# Patient Record
Sex: Female | Born: 1985 | Race: Black or African American | Hispanic: No | Marital: Single | Smoking: Former smoker
Health system: Southern US, Community
[De-identification: ages and names within clinical notes are randomized; demographics above are authoritative.]

## PROBLEM LIST (undated history)

## (undated) DIAGNOSIS — G43909 Migraine, unspecified, not intractable, without status migrainosus: Secondary | ICD-10-CM

## (undated) HISTORY — PX: NO PAST SURGERIES: SHX2092

---
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
## 2007-03-23 ENCOUNTER — Observation Stay: Payer: Self-pay | Admitting: Obstetrics & Gynecology

## 2007-04-06 ENCOUNTER — Inpatient Hospital Stay: Payer: Self-pay | Admitting: Obstetrics and Gynecology

## 2008-12-24 ENCOUNTER — Emergency Department: Payer: Self-pay | Admitting: Emergency Medicine

## 2009-03-12 ENCOUNTER — Observation Stay: Payer: Self-pay

## 2009-06-18 ENCOUNTER — Observation Stay: Payer: Self-pay | Admitting: Obstetrics and Gynecology

## 2009-06-20 ENCOUNTER — Inpatient Hospital Stay: Payer: Self-pay

## 2009-07-20 ENCOUNTER — Emergency Department: Payer: Self-pay | Admitting: Emergency Medicine

## 2010-02-16 ENCOUNTER — Emergency Department: Payer: Self-pay | Admitting: Emergency Medicine

## 2010-02-17 ENCOUNTER — Emergency Department: Payer: Self-pay | Admitting: Emergency Medicine

## 2010-08-13 ENCOUNTER — Emergency Department: Payer: Self-pay

## 2012-12-10 ENCOUNTER — Emergency Department: Payer: Self-pay | Admitting: Internal Medicine
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
## 2016-11-27 ENCOUNTER — Emergency Department
Admission: EM | Admit: 2016-11-27 | Discharge: 2016-11-27 | Disposition: A | Discharge status: Home / Self Care | Payer: Medicaid Other | Attending: Emergency Medicine | Admitting: Emergency Medicine

## 2016-11-27 DIAGNOSIS — Z87891 Personal history of nicotine dependence: Secondary | ICD-10-CM | POA: Insufficient documentation

## 2016-11-27 DIAGNOSIS — R51 Headache: Secondary | ICD-10-CM | POA: Insufficient documentation

## 2016-11-27 DIAGNOSIS — M542 Cervicalgia: Secondary | ICD-10-CM

## 2016-11-27 DIAGNOSIS — R519 Headache, unspecified: Secondary | ICD-10-CM

## 2016-11-27 DIAGNOSIS — M436 Torticollis: Secondary | ICD-10-CM | POA: Diagnosis not present

## 2016-11-27 HISTORY — DX: Migraine, unspecified, not intractable, without status migrainosus: G43.909

## 2016-11-27 MED ORDER — LIDOCAINE 5 % EX PTCH: 1.0000 | MEDICATED_PATCH | Freq: Two times a day (BID) | CUTANEOUS | 0 refills | Status: AC

## 2016-11-27 MED ORDER — LIDOCAINE 5 % EX PTCH: 1.0000 | MEDICATED_PATCH | Freq: Two times a day (BID) | CUTANEOUS | 0 refills | Status: DC

## 2016-11-27 NOTE — ED Provider Notes (Signed)
[facility] Emergency Department Provider Note  ____________________________________________   First MD Initiated Contact with Patient 11/27/16 [phone]     (approximate)  I have reviewed the triage vital signs and the nursing notes.   HISTORY  Chief Complaint Neck Pain and Headache    HPI Cheryl Cameron is a 31 y.o. female G5P3 at 7810 weeks gestation who presents for evaluation of about 3 weeks of persistent yet intermittent left-sided neck and upper shoulder pain that radiates into her head.  She reports that the symptoms seem to be getting worse.  They occur almost exclusively at night.  She feels like she can turn her head to the left but feels more comfortable turning her body.  She suspects it is positional but she cannot change her sleeping position to keep it from happening.  She does find some relief with turning her head to the right as opposed to the left.  She feels like it is deep inside at the area where her shoulder meets her neck.  She describes as a dull aching pain.  She has no numbness nor tingling in any of her extremities.  She has no difficulty with ambulation.  She denies fever/chills, nausea, vomiting, chest pain, shortness of breath, abdominal pain, and vaginal bleeding.  She describes the symptoms as severe.  She has been taking Tylenol and Unisom to help her sleep but she reports it is not helping.  She has seen her primary care doctor once for the issue.  Past Medical History:  Diagnosis Date  . Migraines     There are no active problems to display for this patient.   History reviewed. No pertinent surgical history.  Prior to Admission medications   Medication Sig Start Date End Date Taking? Authorizing Provider  lidocaine (LIDODERM) 5 % Place 1 patch every 12 (twelve) hours onto the skin. Remove & Discard patch within 12 hours or as directed by MD.  Wynelle FannyLeave the patch off for 12 hours before applying a new one. 11/27/16 11/27/17  Loleta RoseForbach,  Paublo Warshawsky, MD    Allergies Patient has no known allergies.  No family history on file.  Social History Social History   Tobacco Use  . Smoking status: Former Games developermoker  . Smokeless tobacco: Never Used  Substance Use Topics  . Alcohol use: No    Frequency: Never  . Drug use: No    Review of Systems Constitutional: No fever/chills Cardiovascular: Denies chest pain. Respiratory: Denies shortness of breath. Gastrointestinal: No abdominal pain.  No nausea, no vomiting.  No diarrhea.  No constipation. Genitourinary: Negative for dysuria. No vaginal bleeding Musculoskeletal: Persistent upper left shoulder and neck pain that radiates into left side of head and face Integumentary: Negative for rash. Neurological: Persistent left sided headache that seems to come from her left neck.  No focal weakness or numbness.   ____________________________________________   PHYSICAL EXAM:  VITAL SIGNS: ED Triage Vitals  Enc Vitals Group     BP 11/27/16 0158 (!) 131/99     Pulse Rate 11/27/16 0158 77     Resp 11/27/16 0158 (!) 22     Temp 11/27/16 0158 98.2 F (36.8 C)     Temp Source 11/27/16 0158 Oral     SpO2 11/27/16 0158 99 %     Weight 11/27/16 0158 81.6 kg (180 lb)     Height 11/27/16 0158 1.753 m (5\' 9" )     Head Circumference --      Peak Flow --  Pain Score 11/27/16 0157 8     Pain Loc --      Pain Edu? --      Excl. in GC? --     Constitutional: Alert and oriented. Well appearing and in no acute distress. Eyes: Conjunctivae are normal. PERRL. EOMI.  No ptosis, no evidence of Horner syndrome Head: Atraumatic. Nose: No congestion/rhinnorhea. Mouth/Throat: Mucous membranes are moist. Neck: No stridor.  No meningeal signs.  Holding her neck in a position that suggests torticollis Cardiovascular: Normal rate, regular rhythm. Good peripheral circulation. No carotid bruit, good circulation upon auscultation with no turbulent flow. Respiratory: Normal respiratory effort.  No  retractions.  Gastrointestinal: Soft and nontender. No distention.  Musculoskeletal: No lower extremity tenderness nor edema. No gross deformities of extremities.  MSK discomfort in left side of neck and upper left shoulder without gross deformity or specific focal tenderness to palpation Neurologic:  Normal speech and language. No gross focal neurologic deficits are appreciated.  Skin:  Skin is warm, dry and intact. No rash noted. Psychiatric: Mood and affect are normal. Speech and behavior are normal.  ____________________________________________   LABS (all labs ordered are listed, but only abnormal results are displayed)  Labs Reviewed - No data to display ____________________________________________  EKG  None - EKG not ordered by ED physician ____________________________________________  RADIOLOGY   No results found.  ____________________________________________   PROCEDURES  Critical Care performed: No   Procedure(s) performed:   Procedures   ____________________________________________   INITIAL IMPRESSION / ASSESSMENT AND PLAN / ED COURSE  As part of my medical decision making, I reviewed the following data within the electronic MEDICAL RECORD NUMBER Nursing notes reviewed and incorporated    Differential diagnosis includes, but is not limited to, migraine, carotid dissection, torticollis, Horner syndrome, neoplasm/mass leading to nerve impingement, radiculopathy, musculoskeletal strain.  Even though the duration of symptoms is unusual, she seems to be suffering from torticollis.  I find no evidence of acute or emergent medical condition on my physical exam and I do not feel she would benefit from any imaging.  I explained all this to the patient and given that it should be safe during pregnancy, I recommended that she try a Lidoderm patch on the area at the top of her shoulder where it begins to emerge with the base of her neck and see if this provides some relief.   I encouraged her to read over the information about torticollis and try some of the additional measures although she is already heating pads and neck massage.  I gave my usual and customary return precautions.     ____________________________________________  FINAL CLINICAL IMPRESSION(S) / ED DIAGNOSES  Final diagnoses:  Torticollis  Neck pain  Nonintractable episodic headache, unspecified headache type     MEDICATIONS GIVEN DURING THIS VISIT:  Medications - No data to display   ED Discharge Orders        Ordered    lidocaine (LIDODERM) 5 %  Every 12 hours,   Status:  Discontinued     11/27/16 0535    lidocaine (LIDODERM) 5 %  Every 12 hours     11/27/16 0537       Note:  This document was prepared using Dragon voice recognition software and may include unintentional dictation errors.    Loleta RoseForbach, Anyelin Mogle, MD 11/27/16 [phone]

## 2016-11-27 NOTE — Discharge Instructions (Signed)
Your workup in the Emergency Department today was reassuring.  Please read through the included information and try some of the recommendations.  We prescribed a lidocaine patch which may provide some relief, but if it is not making a difference, please stop using it.  Follow up with your regular doctor.  Return to the emergency department if you develop new or worsening symptoms that concern you.

## 2016-11-27 NOTE — ED Notes (Signed)
Pt states is [redacted] weeks pregnant. Pt states for a month she has had intermittent left temporal headaches and neck pain. Pt states pain is independent of movement. Pt denies htn, fever, rash, vomiting, nausea. Pt states pain is sometimes relieved by change in position in neck. Pt denies photophobia or sensitivity to sound.

## 2016-11-27 NOTE — ED Triage Notes (Signed)
Patient c/o headache X 3 weeks and neck pain beginning last week. Patient denies any injury to area. Patient is [redacted] weeks pregnant.

## 2020-01-19 DIAGNOSIS — U071 COVID-19: Secondary | ICD-10-CM

## 2020-01-19 HISTORY — DX: COVID-19: U07.1

## 2020-07-08 ENCOUNTER — Emergency Department: Payer: Self-pay

## 2020-07-08 ENCOUNTER — Other Ambulatory Visit: Payer: Self-pay

## 2020-07-08 ENCOUNTER — Emergency Department
Admission: EM | Admit: 2020-07-08 | Discharge: 2020-07-08 | Disposition: A | Discharge status: Home / Self Care | Payer: Self-pay | Attending: Emergency Medicine | Admitting: Emergency Medicine

## 2020-07-08 DIAGNOSIS — M79661 Pain in right lower leg: Secondary | ICD-10-CM | POA: Insufficient documentation

## 2020-07-08 DIAGNOSIS — Z87891 Personal history of nicotine dependence: Secondary | ICD-10-CM | POA: Insufficient documentation

## 2020-07-08 DIAGNOSIS — R000 Tachycardia, unspecified: Secondary | ICD-10-CM | POA: Insufficient documentation

## 2020-07-08 MED ORDER — MELOXICAM 15 MG PO TABS: 15.0000 mg | ORAL_TABLET | Freq: Every day | ORAL | 0 refills | Status: AC

## 2020-07-08 NOTE — ED Provider Notes (Signed)
[facility]-EMERGENCY DEPARTMENT  ____________________________________________  Time seen: Approximately 8:22 PM  I have reviewed the triage vital signs and the nursing notes.   HISTORY  Chief Complaint Leg Pain   Historian Patient     HPI Cheryl Cameron is a 35 y.o. female presents to the emergency department with right calf pain.  Patient reports that she was at work 4 days ago when she developed sudden acute right calf pain after she was "pushing against something".  Patient states that she has had tenderness and swelling since that time.  Patient denies prior history of DVT or PE.  She is a non-smoker.  No recent travel or prolonged immobilization.  No pleuritic chest pain or shortness of breath.   Past Medical History:  Diagnosis Date   Migraines      Immunizations up to date:  Yes.     Past Medical History:  Diagnosis Date   Migraines     There are no problems to display for this patient.   No past surgical history on file.  Prior to Admission medications   Medication Sig Start Date End Date Taking? Authorizing Provider  meloxicam (MOBIC) 15 MG tablet Take 1 tablet (15 mg total) by mouth daily for 7 days. 07/08/20 07/15/20 Yes Orvil Feil, PA-C    Allergies Patient has no known allergies.  No family history on file.  Social History Social History   Tobacco Use   Smoking status: Former    Pack years: 0.00   Smokeless tobacco: Never  Substance Use Topics   Alcohol use: No   Drug use: No     Review of Systems  Constitutional: No fever/chills Eyes:  No discharge ENT: No upper respiratory complaints. Respiratory: no cough. No SOB/ use of accessory muscles to breath Gastrointestinal:   No nausea, no vomiting.  No diarrhea.  No constipation. Musculoskeletal: Patient has right calf pain.  Skin: Negative for rash, abrasions, lacerations, ecchymosis.   ____________________________________________   PHYSICAL EXAM:  VITAL SIGNS: ED Triage  Vitals  Enc Vitals Group     BP 07/08/20 1828 (!) 116/92     Pulse Rate 07/08/20 1828 (!) 101     Resp 07/08/20 1828 18     Temp 07/08/20 1828 98.2 F (36.8 C)     Temp Source 07/08/20 1828 Oral     SpO2 07/08/20 1828 100 %     Weight 07/08/20 1829 212 lb (96.2 kg)     Height 07/08/20 1829 5\' 9"  (1.753 m)     Head Circumference --      Peak Flow --      Pain Score 07/08/20 1829 8     Pain Loc --      Pain Edu? --      Excl. in GC? --      Constitutional: Alert and oriented. Well appearing and in no acute distress. Eyes: Conjunctivae are normal. PERRL. EOMI. Head: Atraumatic. ENT: Cardiovascular: Normal rate, regular rhythm. Normal S1 and S2.  Good peripheral circulation. Respiratory: Normal respiratory effort without tachypnea or retractions. Lungs CTAB. Good air entry to the bases with no decreased or absent breath sounds Gastrointestinal: Bowel sounds x 4 quadrants. Soft and nontender to palpation. No guarding or rigidity. No distention. Musculoskeletal: Full range of motion to all extremities. No obvious deformities noted.  Patient has no calf tenderness to palpation.  Lower extremity compartments are soft.  Palpable dorsalis pedis pulse bilaterally and symmetrically. Neurologic:  Normal for age. No gross focal neurologic deficits  are appreciated.  Skin:  Skin is warm, dry and intact. No rash noted. Psychiatric: Mood and affect are normal for age. Speech and behavior are normal.   ____________________________________________   LABS (all labs ordered are listed, but only abnormal results are displayed)  Labs Reviewed - No data to display ____________________________________________  EKG   ____________________________________________  RADIOLOGY Geraldo Pitter, personally viewed and evaluated these images (plain radiographs) as part of my medical decision making, as well as reviewing the written report by the radiologist.  US Venous Img Lower Unilateral  Right  Result Date: 07/08/2020 CLINICAL DATA:  Right leg pain and swelling for 2 days EXAM: RIGHT LOWER EXTREMITY VENOUS DOPPLER ULTRASOUND TECHNIQUE: Gray-scale sonography with graded compression, as well as color Doppler and duplex ultrasound were performed to evaluate the lower extremity deep venous systems from the level of the common femoral vein and including the common femoral, femoral, profunda femoral, popliteal and calf veins including the posterior tibial, peroneal and gastrocnemius veins when visible. The superficial great saphenous vein was also interrogated. Spectral Doppler was utilized to evaluate flow at rest and with distal augmentation maneuvers in the common femoral, femoral and popliteal veins. COMPARISON:  None. FINDINGS: Contralateral Common Femoral Vein: Respiratory phasicity is normal and symmetric with the symptomatic side. No evidence of thrombus. Normal compressibility. Common Femoral Vein: No evidence of thrombus. Normal compressibility, respiratory phasicity and response to augmentation. Saphenofemoral Junction: No evidence of thrombus. Normal compressibility and flow on color Doppler imaging. Profunda Femoral Vein: No evidence of thrombus. Normal compressibility and flow on color Doppler imaging. Femoral Vein: No evidence of thrombus. Normal compressibility, respiratory phasicity and response to augmentation. Popliteal Vein: No evidence of thrombus. Normal compressibility, respiratory phasicity and response to augmentation. Calf Veins: No evidence of thrombus. Normal compressibility and flow on color Doppler imaging. Superficial Great Saphenous Vein: No evidence of thrombus. Normal compressibility. Venous Reflux:  None. Other Findings:  None. IMPRESSION: No evidence of deep venous thrombosis. Electronically Signed   By: Alcide Clever M.D.   On: 07/08/2020 19:14    ____________________________________________    PROCEDURES  Procedure(s) performed:      Procedures     Medications - No data to display   ____________________________________________   INITIAL IMPRESSION / ASSESSMENT AND PLAN / ED COURSE  Pertinent labs & imaging results that were available during my care of the patient were reviewed by me and considered in my medical decision making (see chart for details).    Assessment and plan Right calf pain 35 year old female presents to the emergency department with right calf pain for the past 2 to 3 days.  Vital signs were reassuring aside from mild tachycardia.  Right lower extremity was nonerythematous and nonedematous.  Patient was neurovascularly intact.  Venous ultrasound showed no signs of DVT.  Suspect gastroc strain at this time.  We will start patient on meloxicam once daily for pain and inflammation and have patient follow-up with primary care as needed.      ____________________________________________  FINAL CLINICAL IMPRESSION(S) / ED DIAGNOSES  Final diagnoses:  Right calf pain      NEW MEDICATIONS STARTED DURING THIS VISIT:  ED Discharge Orders          Ordered    meloxicam (MOBIC) 15 MG tablet  Daily        07/08/20 2018                This chart was dictated using voice recognition software/Dragon. Despite best efforts to  proofread, errors can occur which can change the meaning. Any change was purely unintentional.     Orvil Feil, PA-C 07/08/20 2030    Willy Eddy, MD 07/09/20 1038

## 2020-07-08 NOTE — ED Notes (Signed)
See triage note  Presents with pain and swelling to right calf  Noticed the sx's abotu 3 days ago  No injury   Good pulses

## 2020-07-08 NOTE — Discharge Instructions (Addendum)
Take meloxicam once daily for pain and inflammation. 

## 2020-07-08 NOTE — ED Triage Notes (Signed)
Pt to ED POV for right calf pain for the past 3 days with swelling. Denies injury.  Swelling noted to Right calf.  Reports pain with ambulation

## 2020-07-08 NOTE — ED Notes (Signed)
Patient declined discharge vital signs. 

## 2021-05-09 ENCOUNTER — Emergency Department (HOSPITAL_COMMUNITY): Payer: Self-pay | Admitting: Certified Registered"

## 2021-05-09 ENCOUNTER — Other Ambulatory Visit: Payer: Self-pay

## 2021-05-09 ENCOUNTER — Encounter (HOSPITAL_COMMUNITY)
Admission: EM | Disposition: A | Discharge status: Home / Self Care | Payer: Self-pay | Source: Home / Self Care | Attending: Emergency Medicine

## 2021-05-09 ENCOUNTER — Emergency Department (HOSPITAL_BASED_OUTPATIENT_CLINIC_OR_DEPARTMENT_OTHER): Payer: Self-pay | Admitting: Certified Registered"

## 2021-05-09 ENCOUNTER — Encounter (HOSPITAL_COMMUNITY): Payer: Self-pay | Admitting: *Deleted

## 2021-05-09 ENCOUNTER — Ambulatory Visit (HOSPITAL_COMMUNITY)
Admission: EM | Admit: 2021-05-09 | Discharge: 2021-05-09 | Disposition: A | Discharge status: Home / Self Care | Payer: Self-pay | Attending: Emergency Medicine | Admitting: Emergency Medicine

## 2021-05-09 ENCOUNTER — Emergency Department (HOSPITAL_COMMUNITY): Payer: Self-pay

## 2021-05-09 DIAGNOSIS — E669 Obesity, unspecified: Secondary | ICD-10-CM | POA: Insufficient documentation

## 2021-05-09 DIAGNOSIS — K8010 Calculus of gallbladder with chronic cholecystitis without obstruction: Secondary | ICD-10-CM | POA: Insufficient documentation

## 2021-05-09 DIAGNOSIS — K810 Acute cholecystitis: Secondary | ICD-10-CM

## 2021-05-09 DIAGNOSIS — Z87891 Personal history of nicotine dependence: Secondary | ICD-10-CM | POA: Insufficient documentation

## 2021-05-09 DIAGNOSIS — Z6831 Body mass index (BMI) 31.0-31.9, adult: Secondary | ICD-10-CM | POA: Insufficient documentation

## 2021-05-09 HISTORY — PX: CHOLECYSTECTOMY: SHX55

## 2021-05-09 HISTORY — PX: LYSIS OF ADHESION: SHX5961

## 2021-05-09 LAB — COMPREHENSIVE METABOLIC PANEL
ALT: 17 U/L (ref 0–44)
AST: 15 U/L (ref 15–41)
Albumin: 3.6 g/dL (ref 3.5–5.0)
Alkaline Phosphatase: 35 U/L — ABNORMAL LOW (ref 38–126)
Anion gap: 8 (ref 5–15)
BUN: 9 mg/dL (ref 6–20)
CO2: 22 mmol/L (ref 22–32)
Calcium: 8.7 mg/dL — ABNORMAL LOW (ref 8.9–10.3)
Chloride: 106 mmol/L (ref 98–111)
Creatinine, Ser: 0.98 mg/dL (ref 0.44–1.00)
GFR, Estimated: >60 mL/min (ref >60)
Glucose, Bld: 102 mg/dL — ABNORMAL HIGH (ref 70–99)
Potassium: 3.9 mmol/L (ref 3.5–5.1)
Sodium: 136 mmol/L (ref 135–145)
Total Bilirubin: 0.5 mg/dL (ref 0.3–1.2)
Total Protein: 6.5 g/dL (ref 6.5–8.1)

## 2021-05-09 LAB — CBC WITH DIFFERENTIAL/PLATELET
Abs Immature Granulocytes: 0.03 10*3/uL (ref 0.00–0.07)
Basophils Absolute: 0 10*3/uL (ref 0.0–0.1)
Basophils Relative: 0 %
Eosinophils Absolute: 0.2 10*3/uL (ref 0.0–0.5)
Eosinophils Relative: 3 %
HCT: 39.2 % (ref 36.0–46.0)
Hemoglobin: 12.6 g/dL (ref 12.0–15.0)
Immature Granulocytes: 0 %
Lymphocytes Relative: 29 %
Lymphs Abs: 2.8 10*3/uL (ref 0.7–4.0)
MCH: 28.4 pg (ref 26.0–34.0)
MCHC: 32.1 g/dL (ref 30.0–36.0)
MCV: 88.3 fL (ref 80.0–100.0)
Monocytes Absolute: 0.8 10*3/uL (ref 0.1–1.0)
Monocytes Relative: 8 %
Neutro Abs: 5.5 10*3/uL (ref 1.7–7.7)
Neutrophils Relative %: 60 %
Platelets: 347 10*3/uL (ref 150–400)
RBC: 4.44 MIL/uL (ref 3.87–5.11)
RDW: 14.4 % (ref 11.5–15.5)
WBC: 9.4 10*3/uL (ref 4.0–10.5)
nRBC: 0 % (ref 0.0–0.2)

## 2021-05-09 LAB — I-STAT BETA HCG BLOOD, ED (MC, WL, AP ONLY): I-stat hCG, quantitative: <5 m[IU]/mL (ref <5)

## 2021-05-09 LAB — LIPASE, BLOOD: Lipase: 45 U/L (ref 11–51)

## 2021-05-09 SURGERY — LAPAROSCOPIC CHOLECYSTECTOMY WITH INTRAOPERATIVE CHOLANGIOGRAM
Anesthesia: General

## 2021-05-09 MED ORDER — ONDANSETRON 4 MG PO TBDP
4.0000 mg | ORAL_TABLET | Freq: Once | ORAL | Status: AC
  Administered 2021-05-09: 4 mg via ORAL
  Filled 2021-05-09: qty 1

## 2021-05-09 MED ORDER — SCOPOLAMINE 1 MG/3DAYS TD PT72: 1.0000 | MEDICATED_PATCH | TRANSDERMAL | Status: DC

## 2021-05-09 MED ORDER — KETOROLAC TROMETHAMINE 15 MG/ML IJ SOLN
15.0000 mg | Freq: Once | INTRAMUSCULAR | Status: AC
  Administered 2021-05-09: 15 mg via INTRAMUSCULAR
  Filled 2021-05-09: qty 1

## 2021-05-09 MED ORDER — FENTANYL CITRATE (PF) 100 MCG/2ML IJ SOLN: 25.0000 ug | INTRAMUSCULAR | Status: DC | PRN

## 2021-05-09 MED ORDER — SCOPOLAMINE 1 MG/3DAYS TD PT72
MEDICATED_PATCH | TRANSDERMAL | Status: AC
  Administered 2021-05-09: 1.5 mg via TRANSDERMAL
  Filled 2021-05-09: qty 1

## 2021-05-09 MED ORDER — FENTANYL CITRATE (PF) 250 MCG/5ML IJ SOLN
INTRAMUSCULAR | Status: DC | PRN
Start: 2021-05-09 — End: 2021-05-09
  Administered 2021-05-09: 50 ug via INTRAVENOUS
  Administered 2021-05-09: 150 ug via INTRAVENOUS
  Administered 2021-05-09: 50 ug via INTRAVENOUS

## 2021-05-09 MED ORDER — CHLORHEXIDINE GLUCONATE 0.12 % MT SOLN: 15.0000 mL | Freq: Once | OROMUCOSAL | Status: AC

## 2021-05-09 MED ORDER — MIDAZOLAM HCL 2 MG/2ML IJ SOLN
INTRAMUSCULAR | Status: AC
  Filled 2021-05-09: qty 2

## 2021-05-09 MED ORDER — TRAMADOL HCL 50 MG PO TABS: 50.0000 mg | ORAL_TABLET | Freq: Four times a day (QID) | ORAL | 0 refills | Status: AC | PRN

## 2021-05-09 MED ORDER — ACETAMINOPHEN 10 MG/ML IV SOLN
1000.0000 mg | Freq: Once | INTRAVENOUS | Status: DC | PRN
  Administered 2021-05-09: 1000 mg via INTRAVENOUS

## 2021-05-09 MED ORDER — ONDANSETRON HCL 4 MG/2ML IJ SOLN
INTRAMUSCULAR | Status: AC
  Filled 2021-05-09: qty 2

## 2021-05-09 MED ORDER — DOCUSATE SODIUM 100 MG PO CAPS: 100.0000 mg | ORAL_CAPSULE | Freq: Two times a day (BID) | ORAL | 0 refills | Status: AC

## 2021-05-09 MED ORDER — CHLORHEXIDINE GLUCONATE 0.12 % MT SOLN
OROMUCOSAL | Status: AC
  Administered 2021-05-09: 15 mL via OROMUCOSAL
  Filled 2021-05-09: qty 15

## 2021-05-09 MED ORDER — ACETAMINOPHEN 10 MG/ML IV SOLN
INTRAVENOUS | Status: AC
  Filled 2021-05-09: qty 100

## 2021-05-09 MED ORDER — OXYCODONE HCL 5 MG PO TABS: 5.0000 mg | ORAL_TABLET | ORAL | Status: DC | PRN

## 2021-05-09 MED ORDER — SODIUM CHLORIDE 0.9% FLUSH: 3.0000 mL | Freq: Two times a day (BID) | INTRAVENOUS | Status: DC

## 2021-05-09 MED ORDER — BUPIVACAINE-EPINEPHRINE (PF) 0.25% -1:200000 IJ SOLN
INTRAMUSCULAR | Status: AC
  Filled 2021-05-09: qty 30

## 2021-05-09 MED ORDER — ACETAMINOPHEN 650 MG RE SUPP: 650.0000 mg | RECTAL | Status: DC | PRN

## 2021-05-09 MED ORDER — PROPOFOL 10 MG/ML IV BOLUS
INTRAVENOUS | Status: DC | PRN
  Administered 2021-05-09: 200 mg via INTRAVENOUS

## 2021-05-09 MED ORDER — SODIUM CHLORIDE 0.9% FLUSH: 3.0000 mL | INTRAVENOUS | Status: DC | PRN

## 2021-05-09 MED ORDER — ROCURONIUM BROMIDE 10 MG/ML (PF) SYRINGE
PREFILLED_SYRINGE | INTRAVENOUS | Status: AC
  Filled 2021-05-09: qty 10

## 2021-05-09 MED ORDER — ACETAMINOPHEN 325 MG PO TABS: 650.0000 mg | ORAL_TABLET | ORAL | Status: DC | PRN

## 2021-05-09 MED ORDER — SODIUM CHLORIDE 0.9 % IV SOLN: 250.0000 mL | INTRAVENOUS | Status: DC | PRN

## 2021-05-09 MED ORDER — LIDOCAINE 2% (20 MG/ML) 5 ML SYRINGE
INTRAMUSCULAR | Status: DC | PRN
  Administered 2021-05-09: 60 mg via INTRAVENOUS

## 2021-05-09 MED ORDER — OXYCODONE HCL 5 MG PO TABS: 5.0000 mg | ORAL_TABLET | Freq: Once | ORAL | Status: DC | PRN

## 2021-05-09 MED ORDER — 0.9 % SODIUM CHLORIDE (POUR BTL) OPTIME
TOPICAL | Status: DC | PRN
  Administered 2021-05-09: 1000 mL

## 2021-05-09 MED ORDER — SUGAMMADEX SODIUM 200 MG/2ML IV SOLN
INTRAVENOUS | Status: DC | PRN
  Administered 2021-05-09 (×3): 100 mg via INTRAVENOUS

## 2021-05-09 MED ORDER — BUPIVACAINE-EPINEPHRINE 0.25% -1:200000 IJ SOLN
INTRAMUSCULAR | Status: DC | PRN
  Administered 2021-05-09: 19 mL

## 2021-05-09 MED ORDER — DEXAMETHASONE SODIUM PHOSPHATE 10 MG/ML IJ SOLN
INTRAMUSCULAR | Status: DC | PRN
  Administered 2021-05-09: 5 mg via INTRAVENOUS

## 2021-05-09 MED ORDER — OXYCODONE HCL 5 MG/5ML PO SOLN: 5.0000 mg | Freq: Once | ORAL | Status: DC | PRN

## 2021-05-09 MED ORDER — PROPOFOL 10 MG/ML IV BOLUS
INTRAVENOUS | Status: AC
  Filled 2021-05-09: qty 20

## 2021-05-09 MED ORDER — LACTATED RINGERS IV SOLN: INTRAVENOUS | Status: DC

## 2021-05-09 MED ORDER — AMISULPRIDE (ANTIEMETIC) 5 MG/2ML IV SOLN
10.0000 mg | Freq: Once | INTRAVENOUS | Status: AC | PRN
  Administered 2021-05-09: 10 mg via INTRAVENOUS

## 2021-05-09 MED ORDER — SODIUM CHLORIDE 0.9 % IV SOLN
2.0000 g | Freq: Once | INTRAVENOUS | Status: AC
  Administered 2021-05-09: 2 g via INTRAVENOUS
  Filled 2021-05-09: qty 20

## 2021-05-09 MED ORDER — DEXAMETHASONE SODIUM PHOSPHATE 10 MG/ML IJ SOLN
INTRAMUSCULAR | Status: AC
  Filled 2021-05-09: qty 1

## 2021-05-09 MED ORDER — ONDANSETRON HCL 4 MG/2ML IJ SOLN
INTRAMUSCULAR | Status: DC | PRN
  Administered 2021-05-09: 4 mg via INTRAVENOUS

## 2021-05-09 MED ORDER — ORAL CARE MOUTH RINSE: 15.0000 mL | Freq: Once | OROMUCOSAL | Status: AC

## 2021-05-09 MED ORDER — ROCURONIUM BROMIDE 10 MG/ML (PF) SYRINGE
PREFILLED_SYRINGE | INTRAVENOUS | Status: DC | PRN
  Administered 2021-05-09: 50 mg via INTRAVENOUS

## 2021-05-09 MED ORDER — FENTANYL CITRATE (PF) 250 MCG/5ML IJ SOLN
INTRAMUSCULAR | Status: AC
  Filled 2021-05-09: qty 5

## 2021-05-09 MED ORDER — AMISULPRIDE (ANTIEMETIC) 5 MG/2ML IV SOLN
INTRAVENOUS | Status: AC
  Filled 2021-05-09: qty 4

## 2021-05-09 MED ORDER — SODIUM CHLORIDE 0.9 % IR SOLN
Status: DC | PRN
  Administered 2021-05-09: 1000 mL

## 2021-05-09 MED ORDER — MIDAZOLAM HCL 2 MG/2ML IJ SOLN
INTRAMUSCULAR | Status: DC | PRN
  Administered 2021-05-09: 2 mg via INTRAVENOUS

## 2021-05-09 SURGICAL SUPPLY — 40 items
APPLIER CLIP 5 13 M/L LIGAMAX5 (MISCELLANEOUS) ×2
BAG COUNTER SPONGE SURGICOUNT (BAG) ×2 IMPLANT
CANISTER SUCT 3000ML PPV (MISCELLANEOUS) ×2 IMPLANT
CHLORAPREP W/TINT 26 (MISCELLANEOUS) ×2 IMPLANT
CLIP APPLIE 5 13 M/L LIGAMAX5 (MISCELLANEOUS) ×1 IMPLANT
CNTNR URN SCR LID CUP LEK RST (MISCELLANEOUS) ×1 IMPLANT
CONT SPEC 4OZ STRL OR WHT (MISCELLANEOUS) ×1
COVER MAYO STAND STRL (DRAPES) IMPLANT
COVER SURGICAL LIGHT HANDLE (MISCELLANEOUS) ×2 IMPLANT
DERMABOND ADVANCED (GAUZE/BANDAGES/DRESSINGS) ×1
DERMABOND ADVANCED .7 DNX12 (GAUZE/BANDAGES/DRESSINGS) ×1 IMPLANT
DRAPE C-ARM 42X120 X-RAY (DRAPES) IMPLANT
ELECT REM PT RETURN 9FT ADLT (ELECTROSURGICAL) ×2
ELECTRODE REM PT RTRN 9FT ADLT (ELECTROSURGICAL) ×1 IMPLANT
GAUZE 4X4 16PLY ~~LOC~~+RFID DBL (SPONGE) ×2 IMPLANT
GLOVE BIO SURGEON STRL SZ 6 (GLOVE) ×2 IMPLANT
GLOVE INDICATOR 6.5 STRL GRN (GLOVE) ×2 IMPLANT
GOWN STRL REUS W/ TWL LRG LVL3 (GOWN DISPOSABLE) ×3 IMPLANT
GOWN STRL REUS W/TWL LRG LVL3 (GOWN DISPOSABLE) ×3
GRASPER SUT TROCAR 14GX15 (MISCELLANEOUS) ×2 IMPLANT
KIT BASIN OR (CUSTOM PROCEDURE TRAY) ×2 IMPLANT
KIT TURNOVER KIT B (KITS) ×2 IMPLANT
NDL INSUFFLATION 14GA 120MM (NEEDLE) ×1 IMPLANT
NEEDLE INSUFFLATION 14GA 120MM (NEEDLE) ×2 IMPLANT
NS IRRIG 1000ML POUR BTL (IV SOLUTION) ×2 IMPLANT
PAD ARMBOARD 7.5X6 YLW CONV (MISCELLANEOUS) ×2 IMPLANT
POUCH SPECIMEN RETRIEVAL 10MM (ENDOMECHANICALS) ×2 IMPLANT
SCISSORS LAP 5X35 DISP (ENDOMECHANICALS) ×2 IMPLANT
SET CHOLANGIOGRAPH 5 50 .035 (SET/KITS/TRAYS/PACK) IMPLANT
SET IRRIG TUBING LAPAROSCOPIC (IRRIGATION / IRRIGATOR) ×2 IMPLANT
SET TUBE SMOKE EVAC HIGH FLOW (TUBING) ×2 IMPLANT
SLEEVE ENDOPATH XCEL 5M (ENDOMECHANICALS) ×4 IMPLANT
SPONGE T-LAP 18X18 ~~LOC~~+RFID (SPONGE) ×2 IMPLANT
SUT MNCRL AB 4-0 PS2 18 (SUTURE) ×2 IMPLANT
TOWEL GREEN STERILE (TOWEL DISPOSABLE) ×2 IMPLANT
TOWEL GREEN STERILE FF (TOWEL DISPOSABLE) ×2 IMPLANT
TRAY LAPAROSCOPIC MC (CUSTOM PROCEDURE TRAY) ×2 IMPLANT
TROCAR XCEL NON-BLD 11X100MML (ENDOMECHANICALS) ×2 IMPLANT
TROCAR XCEL NON-BLD 5MMX100MML (ENDOMECHANICALS) ×2 IMPLANT
WATER STERILE IRR 1000ML POUR (IV SOLUTION) ×2 IMPLANT

## 2021-05-09 NOTE — Transfer of Care (Signed)
Immediate Anesthesia Transfer of Care Note ? ?Patient: Cheryl Cameron ? ?Procedure(s) Performed: LAPAROSCOPIC CHOLECYSTECTOMY ?LYSIS OF ADHESION ? ?Patient Location: PACU ? ?Anesthesia Type:General ? ?Level of Consciousness: awake, alert  and oriented ? ?Airway & Oxygen Therapy: Patient Spontanous Breathing and Patient connected to nasal cannula oxygen ? ?Post-op Assessment: Report given to RN and Post -op Vital signs reviewed and stable ? ?Post vital signs: Reviewed and stable ? ?Last Vitals:  ?Vitals Value Taken Time  ?BP 116/83 05/09/21 1602  ?Temp 36.6 ?C 05/09/21 1600  ?Pulse 71 05/09/21 1603  ?Resp 22 05/09/21 1603  ?SpO2 96 % 05/09/21 1603  ?Vitals shown include unvalidated device data. ? ?Last Pain:  ?Vitals:  ? 05/09/21 1343  ?TempSrc: Oral  ?PainSc:   ?   ? ?  ? ?Complications: No notable events documented. ?

## 2021-05-09 NOTE — Anesthesia Postprocedure Evaluation (Signed)
Anesthesia Post Note ? ?Patient: Cheryl Cameron ? ?Procedure(s) Performed: LAPAROSCOPIC CHOLECYSTECTOMY ?LYSIS OF ADHESION ? ?  ? ?Patient location during evaluation: PACU ?Anesthesia Type: General ?Level of consciousness: awake ?Pain management: pain level controlled ?Vital Signs Assessment: post-procedure vital signs reviewed and stable ?Respiratory status: spontaneous breathing, nonlabored ventilation, respiratory function stable and patient connected to nasal cannula oxygen ?Cardiovascular status: blood pressure returned to baseline and stable ?Postop Assessment: no apparent nausea or vomiting ?Anesthetic complications: no ? ? ?No notable events documented. ? ?Last Vitals:  ?Vitals:  ? 05/09/21 1615 05/09/21 1630  ?BP: 120/83 113/84  ?Pulse: 73 71  ?Resp: (!) 21 12  ?Temp:    ?SpO2: 93% 98%  ?  ?Last Pain:  ?Vitals:  ? 05/09/21 1630  ?TempSrc:   ?PainSc: 0-No pain  ? ? ?  ?  ?  ?  ?  ?  ? ?Merri Dimaano P Curlie Sittner ? ? ? ? ?

## 2021-05-09 NOTE — Discharge Instructions (Signed)
LAPAROSCOPIC SURGERY: POST OP INSTRUCTIONS   EAT Gradually transition to a high fiber diet with a fiber supplement over the next few weeks after discharge.  Start with a pureed / full liquid diet (see below)  WALK Walk an hour a day.  Control your pain to do that.    CONTROL PAIN Control pain so that you can walk, sleep, tolerate sneezing/coughing, go up/down stairs.  HAVE A BOWEL MOVEMENT DAILY Keep your bowels regular to avoid problems.  OK to try a laxative to override constipation.  OK to use an antidairrheal to slow down diarrhea.  Call if not better after 2 tries  CALL IF YOU HAVE PROBLEMS/CONCERNS Call if you are still struggling despite following these instructions. Call if you have concerns not answered by these instructions    DIET: Follow a light bland diet & liquids the first 24 hours after arrival home, such as soup, liquids, starches, etc.  Be sure to drink plenty of fluids.  Quickly advance to a usual solid diet within a few days.  Avoid fast food or heavy meals as your are more likely to get nauseated or have irregular bowels.  A low-sugar, high-fiber diet for the rest of your life is ideal.  Take your usually prescribed home medications unless otherwise directed.  PAIN CONTROL: Pain is best controlled by a usual combination of three different methods TOGETHER: Ice/Heat Over the counter pain medication Prescription pain medication Most patients will experience some swelling and bruising around the incisions.  Ice packs or heating pads (30-60 minutes up to 6 times a day) will help. Use ice for the first few days to help decrease swelling and bruising, then switch to heat to help relax tight/sore spots and speed recovery.  Some people prefer to use ice alone, heat alone, alternating between ice & heat.  Experiment to what works for you.  Swelling and bruising can take several weeks to resolve.   It is helpful to take an over-the-counter pain medication regularly for the  first few days: Naproxen (Aleve, etc)  Two 220mg tabs twice a day OR Ibuprofen (Advil, etc) Three 200mg tabs four times a day (every meal & bedtime) AND Acetaminophen (Tylenol, etc) 500-650mg four times a day (every meal & bedtime) A  prescription for pain medication (such as oxycodone, hydrocodone, tramadol, gabapentin, methocarbamol, etc) should be given to you upon discharge.  Take your pain medication as prescribed, IF NEEDED.  If you are having problems/concerns with the prescription medicine (does not control pain, nausea, vomiting, rash, itching, etc), please call us [phone] to see if we need to switch you to a different pain medicine that will work better for you and/or control your side effect better. If you need a refill on your pain medication, please give us 48 hour notice.  contact your pharmacy.  They will contact our office to request authorization. Prescriptions will not be filled after 5 pm or on week-ends  Avoid getting constipated.   Between the surgery and the pain medications, it is common to experience some constipation.   Increasing fluid intake and taking a fiber supplement (such as Metamucil, Citrucel, FiberCon, MiraLax, etc) 1-2 times a day regularly will usually help prevent this problem from occurring.   A mild laxative (prune juice, Milk of Magnesia, MiraLax, etc) should be taken according to package directions if there are no bowel movements after 48 hours.   Watch out for diarrhea.   If you have many loose bowel movements, simplify your diet   to bland foods & liquids for a few days.   Stop any stool softeners and decrease your fiber supplement.   Switching to mild anti-diarrheal medications (Kayopectate, Pepto Bismol) can help.   If this worsens or does not improve, please call us.  Wash / shower every day.  You may shower over the skin glue which is waterproof  Glue will flake off after about 2 weeks.  You may leave the incision open to air.  You may replace a  dressing/Band-Aid to cover the incision for comfort if you wish.   ACTIVITIES as tolerated:   You may resume regular (light) daily activities beginning the next day--such as daily self-care, walking, climbing stairs--gradually increasing activities as tolerated.  If you can walk 30 minutes without difficulty, it is safe to try more intense activity such as jogging, treadmill, bicycling, low-impact aerobics, swimming, etc. Save the most intensive and strenuous activity for last such as sit-ups, heavy lifting, contact sports, etc  Refrain from any heavy lifting or straining until you are off narcotics for pain control.   DO NOT PUSH THROUGH PAIN.  Let pain be your guide: If it hurts to do something, don't do it.  Pain is your body warning you to avoid that activity for another week until the pain goes down. You may drive when you are no longer taking prescription pain medication, you can comfortably wear a seatbelt, and you can safely maneuver your car and apply brakes. You may have sexual intercourse when it is comfortable.  FOLLOW UP in our office Please call [facility] at [phone] to set up an appointment to see your surgeon in the office for a follow-up appointment approximately 2-3 weeks after your surgery. Make sure that you call for this appointment the day you arrive home to insure a convenient appointment time.  10. IF YOU HAVE DISABILITY OR FAMILY LEAVE FORMS, BRING THEM TO THE OFFICE FOR PROCESSING.  DO NOT GIVE THEM TO YOUR DOCTOR.   WHEN TO CALL US [phone]: Poor pain control Reactions / problems with new medications (rash/itching, nausea, etc)  Fever over 101.5 F (38.5 C) Inability to urinate Nausea and/or vomiting Worsening swelling or bruising Continued bleeding from incision. Increased pain, redness, or drainage from the incision   The clinic staff is available to answer your questions during regular business hours (8:30am-5pm).  Please don't hesitate to call and ask to  speak to one of our nurses for clinical concerns.   If you have a medical emergency, go to the nearest emergency room or call 911.  A surgeon from [facility] is always on call at the hospitals   [facility] [address], [address], [place], [place]  [address] ? MAIN: [phone] ? TOLL FREE: [phone] ?  FAX [phone] [email or web address]     

## 2021-05-09 NOTE — Anesthesia Preprocedure Evaluation (Addendum)
Anesthesia Evaluation  ?Patient identified by MRN, date of birth, ID band ?Patient awake ? ? ? ?Reviewed: ?Allergy & Precautions, NPO status , Patient's Chart, lab work & pertinent test results ? ?Airway ?Mallampati: II ? ?TM Distance: >3 FB ?Neck ROM: Full ? ? ? Dental ?no notable dental hx. ? ?  ?Pulmonary ?former smoker,  ?  ?Pulmonary exam normal ? ? ? ? ? ? ? Cardiovascular ?negative cardio ROS ?Normal cardiovascular exam ? ? ?  ?Neuro/Psych ? Headaches, negative psych ROS  ? GI/Hepatic ?negative GI ROS, Neg liver ROS,   ?Endo/Other  ?negative endocrine ROS ? Renal/GU ?negative Renal ROS  ? ?  ?Musculoskeletal ?negative musculoskeletal ROS ?(+)  ? Abdominal ?(+) + obese,   ?Peds ? Hematology ?negative hematology ROS ?(+)   ?Anesthesia Other Findings ?Acute Cholecystitis ? Reproductive/Obstetrics ? ?  ? ? ? ? ? ? ? ? ? ? ? ? ? ?  ?  ? ? ? ? ? ? ? ?Anesthesia Physical ?Anesthesia Plan ? ?ASA: 2 ? ?Anesthesia Plan: General  ? ?Post-op Pain Management:   ? ?Induction: Intravenous ? ?PONV Risk Score and Plan: 4 or greater and Ondansetron, Dexamethasone, Midazolam, Scopolamine patch - Pre-op and Treatment may vary due to age or medical condition ? ?Airway Management Planned: Oral ETT ? ?Additional Equipment:  ? ?Intra-op Plan:  ? ?Post-operative Plan: Extubation in OR ? ?Informed Consent: I have reviewed the patients History and Physical, chart, labs and discussed the procedure including the risks, benefits and alternatives for the proposed anesthesia with the patient or authorized representative who has indicated his/her understanding and acceptance.  ? ? ? ?Dental advisory given ? ?Plan Discussed with: CRNA ? ?Anesthesia Plan Comments:   ? ? ? ? ? ?Anesthesia Quick Evaluation ? ?

## 2021-05-09 NOTE — H&P (Signed)
? ? ? ? ?H&P Note ? ?Cheryl Cameron ?1985/10/29  ?[id number].   ? ?Requesting MD: Tanda Rockers, DO ?Chief Complaint/Reason for Consult:  RUQ pain  ? ?HPI:  ?Patient is an otherwise healthy 36 year old female who presented to the ED with abdominal pain since yesterday AM. Pain in epigastric abdomen and RUQ and has progressively worsened since onset. Some nausea since being in the ED but no vomiting. Afebrile. Denies chest pain, SOB changes in bowel/bladder habits. Patient denies having similar episodes of pain previously. Pain only minimally improved after pain medication in the the ED. No prior abdominal surgery. No blood thinners. NKDA.  ? ?ROS: ?Review of Systems  ?Constitutional:  Negative for chills and fever.  ?Respiratory:  Negative for shortness of breath and wheezing.   ?Cardiovascular:  Negative for chest pain and palpitations.  ?Gastrointestinal:  Positive for abdominal pain and nausea. Negative for constipation, diarrhea and vomiting.  ?Genitourinary:  Negative for dysuria, frequency and urgency.  ? ?No family history on file. ? ?Past Medical History:  ?Diagnosis Date  ? Migraines   ? ? ?History reviewed. No pertinent surgical history. ? ?Social History:  reports that she has quit smoking. She has never used smokeless tobacco. She reports that she does not drink alcohol and does not use drugs. ? ?Allergies: No Known Allergies ? ?(Not in a hospital admission) ? ? ?Blood pressure 110/82, pulse 71, temperature 98 ?F (36.7 ?C), temperature source Oral, resp. rate 17, height 5\' 9"  (1.753 m), weight 96.2 kg, last menstrual period 04/08/2021, SpO2 100 %, unknown if currently breastfeeding. ?Physical Exam:  ?General: pleasant, WD, overweight female who is laying in bed in NAD ?HEENT: head is normocephalic, atraumatic.  Sclera are anicteric.  Ears and nose without any masses or lesions.  Mouth is pink and moist ?Heart: regular, rate, and rhythm.  Normal s1,s2. No obvious murmurs, gallops, or rubs noted.  Palpable  radial and pedal pulses bilaterally ?Lungs: CTAB, no wheezes, rhonchi, or rales noted.  Respiratory effort nonlabored ?Abd: soft, ttp in RUQ with positive murphy sign, ND, +BS, no masses, hernias, or organomegaly ?MS: all 4 extremities are symmetrical with no cyanosis, clubbing, or edema. ?Skin: warm and dry with no masses, lesions, or rashes ?Neuro: Cranial nerves 2-12 grossly intact, sensation is normal throughout ?Psych: A&Ox3 with an appropriate affect. ? ? ?Results for orders placed or performed during the hospital encounter of 05/09/21 (from the past 48 hour(s))  ?CBC with Differential     Status: None  ? Collection Time: 05/09/21  3:41 AM  ?Result Value Ref Range  ? WBC 9.4 4.0 - 10.5 K/uL  ? RBC 4.44 3.87 - 5.11 MIL/uL  ? Hemoglobin 12.6 12.0 - 15.0 g/dL  ? HCT 39.2 36.0 - 46.0 %  ? MCV 88.3 80.0 - 100.0 fL  ? MCH 28.4 26.0 - 34.0 pg  ? MCHC 32.1 30.0 - 36.0 g/dL  ? RDW 14.4 11.5 - 15.5 %  ? Platelets 347 150 - 400 K/uL  ? nRBC 0.0 0.0 - 0.2 %  ? Neutrophils Relative % 60 %  ? Neutro Abs 5.5 1.7 - 7.7 K/uL  ? Lymphocytes Relative 29 %  ? Lymphs Abs 2.8 0.7 - 4.0 K/uL  ? Monocytes Relative 8 %  ? Monocytes Absolute 0.8 0.1 - 1.0 K/uL  ? Eosinophils Relative 3 %  ? Eosinophils Absolute 0.2 0.0 - 0.5 K/uL  ? Basophils Relative 0 %  ? Basophils Absolute 0.0 0.0 - 0.1 K/uL  ?  Immature Granulocytes 0 %  ? Abs Immature Granulocytes 0.03 0.00 - 0.07 K/uL  ?  Comment: Performed at [facility], [address] [place], [place] [address]  ?Comprehensive metabolic panel     Status: Abnormal  ? Collection Time: 05/09/21  3:41 AM  ?Result Value Ref Range  ? Sodium 136 135 - 145 mmol/L  ? Potassium 3.9 3.5 - 5.1 mmol/L  ? Chloride 106 98 - 111 mmol/L  ? CO2 22 22 - 32 mmol/L  ? Glucose, Bld 102 (H) 70 - 99 mg/dL  ?  Comment: Glucose reference range applies only to samples taken after fasting for at least 8 hours.  ? BUN 9 6 - 20 mg/dL  ? Creatinine, Ser 0.98 0.44 - 1.00 mg/dL  ? Calcium 8.7 (L) 8.9 - 10.3 mg/dL  ?  Total Protein 6.5 6.5 - 8.1 g/dL  ? Albumin 3.6 3.5 - 5.0 g/dL  ? AST 15 15 - 41 U/L  ? ALT 17 0 - 44 U/L  ? Alkaline Phosphatase 35 (L) 38 - 126 U/L  ? Total Bilirubin 0.5 0.3 - 1.2 mg/dL  ? GFR, Estimated >60 >60 mL/min  ?  Comment: (NOTE) ?Calculated using the CKD-EPI Creatinine Equation (2021) ?  ? Anion gap 8 5 - 15  ?  Comment: Performed at [facility], [address] [place], [place] [address]  ?Lipase, blood     Status: None  ? Collection Time: 05/09/21  3:41 AM  ?Result Value Ref Range  ? Lipase 45 11 - 51 U/L  ?  Comment: Performed at [facility], [address] [place], [place] [address]  ?I-Stat Beta hCG blood, ED ([facility], [facility], [facility] only)     Status: None  ? Collection Time: 05/09/21  3:46 AM  ?Result Value Ref Range  ? I-stat hCG, quantitative <5.0 <5 mIU/mL  ? Comment 3          ?  Comment:   GEST. AGE      CONC.  (mIU/mL) ?  <=1 WEEK        5 - 50 ?    2 WEEKS       50 - 500 ?    3 WEEKS       100 - 10,000 ?    4 WEEKS     1,000 - 30,000 ?       ?FEMALE AND NON-PREGNANT FEMALE: ?    LESS THAN 5 mIU/mL ?  ? ?US Abdomen Limited RUQ (LIVER/GB) ? ?Result Date: 05/09/2021 ?CLINICAL DATA:  Right upper quadrant pain EXAM: ULTRASOUND ABDOMEN LIMITED RIGHT UPPER QUADRANT COMPARISON:  None. FINDINGS: Gallbladder: Distended gallbladder with calculi but no wall thickening or focal tenderness. Common bile duct: Diameter: 4 mm Liver: Echogenic liver when compared to the kidney with geographic area of central sparing. No masslike areas. Portal vein is patent on color Doppler imaging with normal direction of blood flow towards the liver. IMPRESSION: 1. Cholelithiasis and distended gallbladder but no gallbladder wall thickening or focal tenderness typical of acute cholecystitis. 2. Hepatic steatosis. Electronically Signed   By: Tiburcio PeaJonathan  Watts M.D.   On: 05/09/2021 04:32   ? ? ? ?Assessment/Plan ?Acute cholecystitis  ?- RUQ US with cholelithiasis ?- no leukocytosis and afebrile ?- no elevation in LFTs ?- still  TTP on exam  ?- MD to see, but likely proceed to OR for lap chole ?-  I have explained the procedure, risks, and aftercare of Laparoscopic cholecystectomy  with IOC.  Risks include but are not limited to anesthesia (MI, CVA, death), bleeding, infection, wound problems, hernia, bile leak, injury to common bile duct/liver/intestine, increased risk of DVT/PE and diarrhea post op.  She seems to understand and agrees to proceed.  ? ?Possible discharge from PACU post-op, if patient requires admission then will admit to observation.  ? ?I reviewed RUQ [place], CMET/CBC.  ? ? ?Juliet Rude, PA-C ?[facility] ?05/09/2021, 11:32 AM ?Please see Amion for pager number during day hours 7:00am-4:30pm ?  ?

## 2021-05-09 NOTE — ED Provider Triage Note (Signed)
Emergency Medicine Provider Triage Evaluation Note ? ?Cheryl Cameron , a 36 y.o. female  was evaluated in triage.  Pt complains of abdominal pain.  Began tonight around 7:00, had to leave work early and thought maybe it was gas but pain is persisted.  She reports nausea but denies vomiting. ? ?Review of Systems  ?Positive: Abdominal pain ?Negative: Fever ? ?Physical Exam  ?BP 124/78 (BP Location: Left Arm)   Pulse 67   Temp 97.8 ?F (36.6 ?C) (Oral)   Resp 17   Ht 5\' 9"  (1.753 m)   Wt 96.2 kg   SpO2 98%   BMI 31.32 kg/m?  ?Gen:   Awake, no distress   ?Resp:  Normal effort  ?MSK:   Moves extremities without difficulty  ?Other:  RUQ tenderness on exam ? ?Medical Decision Making  ?Medically screening exam initiated at 3:34 AM.  Appropriate orders placed.  FREDI HURTADO was informed that the remainder of the evaluation will be completed by another provider, this initial triage assessment does not replace that evaluation, and the importance of remaining in the ED until their evaluation is complete. ? ?RUQ pain.  Labs, Cheryl Cameron. ?  ?[place], PA-C ?05/09/21 05/11/21 ? ?

## 2021-05-09 NOTE — ED Triage Notes (Signed)
The pt is c/o rt sided abd pain for 2-3 days worse tonight  lmp week ?

## 2021-05-09 NOTE — Anesthesia Procedure Notes (Signed)
Procedure Name: Intubation ?Date/Time: 05/09/2021 3:01 PM ?Performed by: Trinna Post., CRNA ?Pre-anesthesia Checklist: Patient identified, Emergency Drugs available, Suction available, Patient being monitored and Timeout performed ?Patient Re-evaluated:Patient Re-evaluated prior to induction ?Oxygen Delivery Method: Circle system utilized ?Preoxygenation: Pre-oxygenation with 100% oxygen ?Induction Type: IV induction ?Ventilation: Mask ventilation without difficulty ?Laryngoscope Size: Mac and 3 ?Grade View: Grade I ?Tube type: Oral ?Tube size: 7.0 mm ?Number of attempts: 1 ?Airway Equipment and Method: Stylet ?Placement Confirmation: ETT inserted through vocal cords under direct vision, positive ETCO2 and breath sounds checked- equal and bilateral ?Secured at: 23 cm ?Tube secured with: Tape ?Dental Injury: Teeth and Oropharynx as per pre-operative assessment  ? ? ? ? ?

## 2021-05-09 NOTE — Op Note (Signed)
Operative Note ? ?Cheryl Cameron 36 y.o. female ?[id number]  ?05/09/2021 ? ?Surgeon: Berna Bue MD FACS ? ?Procedure performed: Laparoscopic Cholecystectomy ? ?Procedure classification: emergent ? ?Preop diagnosis: Acute cholecystitis ?Post-op diagnosis/intraop findings: same ? ?Specimens: gallbladder ? ?Retained items: none ? ?EBL: minimal ? ?Complications: none ? ?Description of procedure: After obtaining informed consent the patient was brought to the operating room. Antibiotics were administered. SCD's were applied. General endotracheal anesthesia was initiated and a formal time-out was performed. The abdomen was prepped and draped in the usual sterile fashion and the abdomen was entered using an infraumbilical veress needle after instilling the site with local. Insufflation to was obtained, 69mm trocar and camera inserted, and gross inspection revealed no evidence of injury from our entry or other intraabdominal abnormalities. Two 62mm trocars were introduced in the right midclavicular and right anterior axillary lines under direct visualization and following infiltration with local. An 20mm trocar was placed in the epigastrium. The gallbladder was retracted cephalad and the infundibulum was retracted laterally. A combination of hook electrocautery and blunt dissection was utilized to clear the peritoneum from the neck and cystic duct, circumferentially isolating the cystic artery and cystic duct and lifting the gallbladder from the cystic plate. The critical view of safety was achieved with the cystic artery, cystic duct, and liver bed visualized between them with no other structures.  The cystic duct was clipped 3 times proximally and 1 distally and then divided with the scissors.  The cystic artery tract more towards the liver so this was dissected away from the gallbladder quite a distance until it was clearly entering the gallbladder fairly far upon the fundus.  The artery was clipped here before  dividing distally with cautery.  The gallbladder was dissected from the liver plate using electrocautery. Once freed the gallbladder was placed in an endocatch bag and removed intact through the epigastric trocar site. A small amount of bleeding on the liver bed was controlled with cautery.  The right upper quadrant was irrigated and aspirated, the effluent was clear. Hemostasis was once again confirmed, and reinspection of the abdomen revealed no injuries. The clips were well opposed without any bile leak from the duct or the liver bed.  There was an omental adhesion to the gallbladder which was sharply divided.  The 48mm trocar site in the epigastrium was closed with a 0 vicryl in the fascia under direct visualization using a PMI device. The abdomen was desufflated and all trocars removed. The skin incisions were closed with subcuticular 4-0 monocryl and Dermabond. The patient was awakened, extubated and transported to the recovery room in stable condition.  ?  ?All counts were correct at the completion of the case. ? ? ?  ?

## 2021-05-10 ENCOUNTER — Encounter (HOSPITAL_COMMUNITY): Payer: Self-pay | Admitting: Surgery

## 2021-05-10 ENCOUNTER — Other Ambulatory Visit: Payer: Self-pay

## 2021-05-10 NOTE — ED Provider Notes (Signed)
?[facility] ?Provider Note ? ? ?CSN: [id number] ?Arrival date & time: 05/09/21  0241 ? ?  ? ?History ?Chief Complaint  ?Patient presents with  ? Abdominal Pain  ? ? ?Cheryl Cameron is a 36 y.o. female otherwise healthy presents emerged department evaluation of right upper quadrant pain for the past 24 hours.  She reports that there are some radiation to her back.  She has had some nausea but without any vomiting.  Denies any chest pain, shortness breath, fevers, diarrhea, constipation, dysuria, or hematuria.  She denies having any similar symptoms previously.  She reports that the pain happened after eating [place] food.  No pain medication taken prior to arrival.  She denies any medical or surgical history.  Denies any daily medications.  No known drug allergies.  Denies any tobacco, EtOH, or illicit drug use ever. ? ? ?Abdominal Pain ?Associated symptoms: nausea   ?Associated symptoms: no chest pain, no chills, no constipation, no diarrhea, no dysuria, no fever, no hematuria, no shortness of breath and no vomiting   ? ?  ? ?Home Medications ?Prior to Admission medications   ?Medication Sig Start Date End Date Taking? Authorizing Provider  ?docusate sodium (COLACE) 100 MG capsule Take 1 capsule (100 mg total) by mouth 2 (two) times daily. Okay to decrease to once daily or stop taking if having loose bowel movements 05/09/21 06/08/21 Yes Clovis Sanford Lindblad, MD  ?traMADol (ULTRAM) 50 MG tablet Take 1 tablet (50 mg total) by mouth every 6 (six) hours as needed for up to 5 days (Pain not relieved by Tylenol, ibuprofen, rest or ice). 05/09/21 05/14/21 Yes Clovis Enaya Howze, MD  ?   ? ?Allergies    ?Patient has no known allergies.   ? ?Review of Systems   ?Review of Systems  ?Constitutional:  Negative for chills and fever.  ?Respiratory:  Negative for shortness of breath.   ?Cardiovascular:  Negative for chest pain.  ?Gastrointestinal:  Positive for abdominal pain and nausea. Negative for constipation,  diarrhea and vomiting.  ?Genitourinary:  Negative for dysuria and hematuria.  ?Musculoskeletal:  Positive for back pain.  ? ?Physical Exam ?Updated Vital Signs ?BP 113/84 (BP Location: Right Arm)   Pulse 71   Temp 97.8 ?F (36.6 ?C)   Resp 12   Ht 5' 9.02" (1.753 m)   Wt 96.2 kg   LMP 04/08/2021   SpO2 98%   BMI 31.30 kg/m?  ?Physical Exam ?Constitutional:   ?   Appearance: Normal appearance. She is not toxic-appearing.  ?   Comments: Tearful  ?HENT:  ?   Head: Normocephalic and atraumatic.  ?Eyes:  ?   General: No scleral icterus. ?Cardiovascular:  ?   Rate and Rhythm: Normal rate and regular rhythm.  ?Pulmonary:  ?   Effort: Pulmonary effort is normal.  ?   Breath sounds: Normal breath sounds.  ?Abdominal:  ?   General: Bowel sounds are normal. There is no distension.  ?   Palpations: Abdomen is soft.  ?   Tenderness: There is abdominal tenderness in the right upper quadrant. There is no guarding or rebound. Positive signs include Murphy's sign.  ?Musculoskeletal:     ?   General: No deformity.  ?   Cervical back: Normal range of motion.  ?Skin: ?   General: Skin is warm and dry.  ?Neurological:  ?   General: No focal deficit present.  ?   Mental Status: She is alert. Mental status is at baseline.  ? ? ?  ED Results / Procedures / Treatments   ?Labs ?(all labs ordered are listed, but only abnormal results are displayed) ?Labs Reviewed  ?COMPREHENSIVE METABOLIC PANEL - Abnormal; Notable for the following components:  ?    Result Value  ? Glucose, Bld 102 (*)   ? Calcium 8.7 (*)   ? Alkaline Phosphatase 35 (*)   ? All other components within normal limits  ?CBC WITH DIFFERENTIAL/PLATELET  ?LIPASE, BLOOD  ?I-STAT BETA HCG BLOOD, ED ([facility], [facility], [facility] ONLY)  ?SURGICAL PATHOLOGY  ? ? ?EKG ?None ? ?Radiology ?US Abdomen Limited RUQ (LIVER/GB) ? ?Result Date: 05/09/2021 ?CLINICAL DATA:  Right upper quadrant pain EXAM: ULTRASOUND ABDOMEN LIMITED RIGHT UPPER QUADRANT COMPARISON:  None. FINDINGS: Gallbladder: Distended  gallbladder with calculi but no wall thickening or focal tenderness. Common bile duct: Diameter: 4 mm Liver: Echogenic liver when compared to the kidney with geographic area of central sparing. No masslike areas. Portal vein is patent on color Doppler imaging with normal direction of blood flow towards the liver. IMPRESSION: 1. Cholelithiasis and distended gallbladder but no gallbladder wall thickening or focal tenderness typical of acute cholecystitis. 2. Hepatic steatosis. Electronically Signed   By: Jorje Guild M.D.   On: 05/09/2021 04:32   ? ?Procedures ?Procedures  ? ?Medications Ordered in ED ?Medications  ?ketorolac (TORADOL) 15 MG/ML injection 15 mg (15 mg Intramuscular Given 05/09/21 1058)  ?ondansetron (ZOFRAN-ODT) disintegrating tablet 4 mg (4 mg Oral Given 05/09/21 1058)  ?cefTRIAXone (ROCEPHIN) 2 g in sodium chloride 0.9 % 100 mL IVPB (0 g Intravenous Stopped 05/09/21 1252)  ?chlorhexidine (PERIDEX) 0.12 % solution 15 mL (15 mLs Mouth/Throat Given 05/09/21 1338)  ?  Or  ?MEDLINE mouth rinse ( Mouth Rinse See Alternative 05/09/21 1338)  ?amisulpride (BARHEMSYS) injection 10 mg (10 mg Intravenous Given 05/09/21 1628)  ? ? ?ED Course/ Medical Decision Making/ A&P ?  ?                        ?Medical Decision Making ?Risk ?Prescription drug management. ?Decision regarding hospitalization. ? ? ?35 year old female presents emerged department for evaluation of right upper quadrant pain for the past 24 hours with some any radiation to her back.  Differential diagnosis includes was not limited to cholecystitis, cholelithiasis, choledocholithiasis, cholangitis, pancreatitis, viral gastroenteritis.  Vital signs are unremarkable.  Patient normotensive, afebrile, normal pulse rate, satting well on room air without any increased work of breathing.  Physical exam is pertinent for some right upper quadrant tenderness without any guarding or rebound.  Normal active bowel sounds.  Positive Murphy sign.  Abdomen soft  without any overlying skin changes noted. ? ?Labs and ultrasound ordered in triage. ? ?The patient was given Toradol and Zofran in the emergency department. ? ?I independently reviewed and interpreted the patient's labs and imaging and agree with radiologist interpretation.  hCG negative.  Lipase normal.  CMP shows mildly elevated glucose at 102 although not fasting.  Mild decrease in calcium at 8.7.  Mild decrease in alk phos at 35 otherwise no electrolyte or other LFT abnormalities.  CBC shows no leukocytosis or anemia.  Right upper quadrant ultrasound shows cholelithiasis and distended gallbladder but no gallbladder wall thickening or focal tenderness typical of acute cholecystitis.  ? ?Given the patient's tearful state, pain, and positive Murphy sign, I asked that general surgery come evaluate the patient at bedside.  Patient was evaluated by PA Barkley Boards with [facility] surgery.  She had her attending Dr. Amado Coe assessed the  patient.  Per their note, they were taking the patient to the OR. ? ?Final Clinical Impression(s) / ED Diagnoses ?Final diagnoses:  ?Calculus of gallbladder with cholecystitis without biliary obstruction, unspecified cholecystitis acuity  ? ? ?Rx / DC Orders ?ED Discharge Orders   ? ?      Ordered  ?  traMADol (ULTRAM) 50 MG tablet  Every 6 hours PRN       ? 05/09/21 1556  ?  docusate sodium (COLACE) 100 MG capsule  2 times daily       ? 05/09/21 1556  ? ?  ?  ? ?  ? ? ?  ?Sherrell Puller, PA-C ?05/10/21 1819 ? ?  ?Jeanell Sparrow, DO ?05/11/21 1004 ? ?

## 2021-05-12 LAB — SURGICAL PATHOLOGY

## 2021-09-25 ENCOUNTER — Ambulatory Visit: Payer: No Typology Code available for payment source | Admitting: Nurse Practitioner

## 2021-09-25 ENCOUNTER — Ambulatory Visit: Payer: Self-pay | Admitting: Nurse Practitioner

## 2021-09-25 ENCOUNTER — Telehealth: Payer: Self-pay | Admitting: Nurse Practitioner

## 2021-09-25 NOTE — Telephone Encounter (Signed)
1st no show, fee waived, letter sent 

## 2021-09-25 NOTE — Telephone Encounter (Signed)
9.8.23 no show letter sent

## 2023-02-12 IMAGING — US US EXTREM LOW VENOUS*R*
1 series · 13 of 24 positions shown · non-contrast
Comparison: None.

CLINICAL DATA: Right leg pain and swelling for 2 days



[Series 1: us venous img lower uni right (dvt) · portal-venous · 13 of 34 slices shown]
[im 1/34]
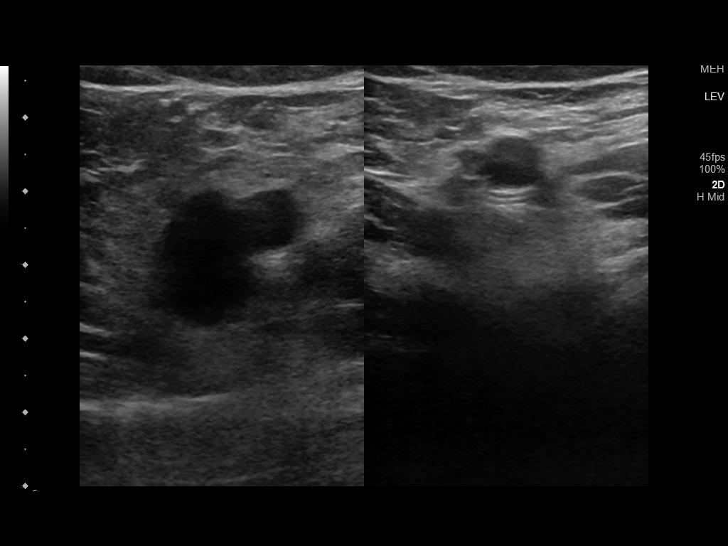
[im 3/34]
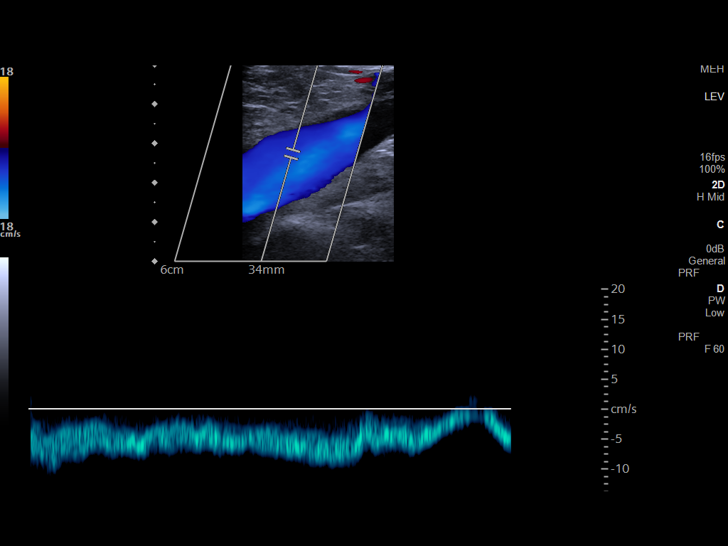
[im 6/34]
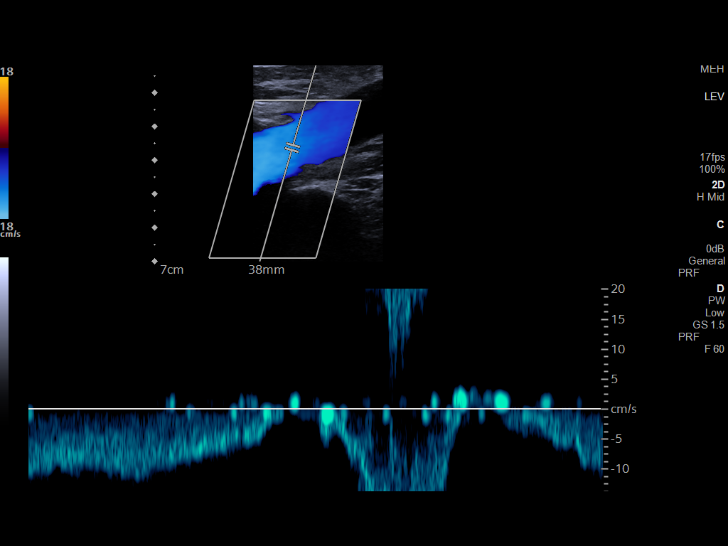
[im 9/34]
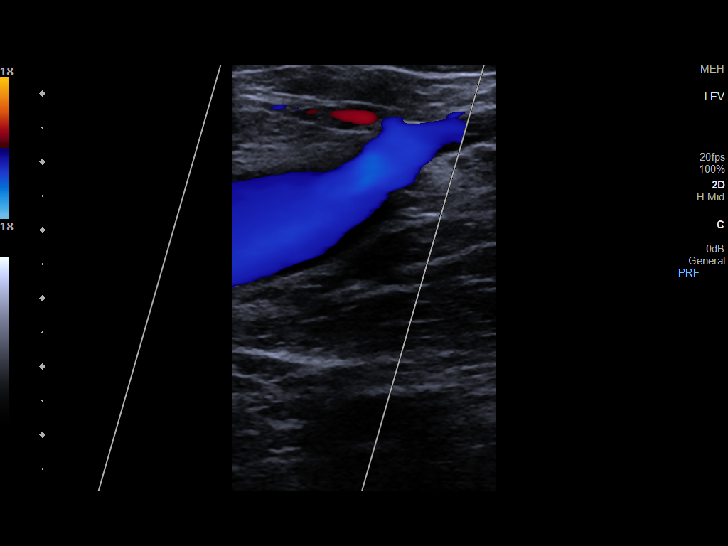
[im 12/34]
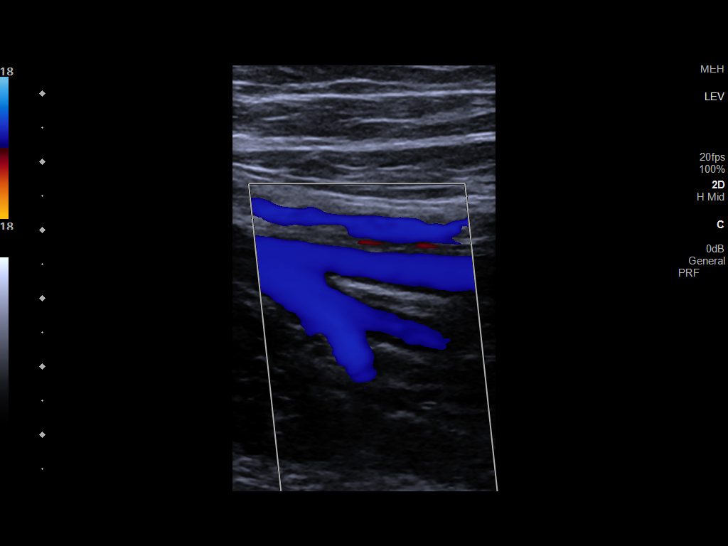
[im 15/34]
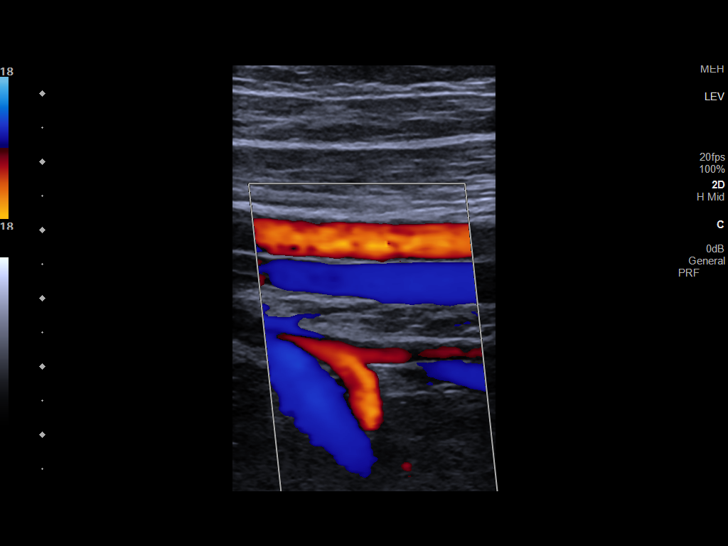
[im 18/34]
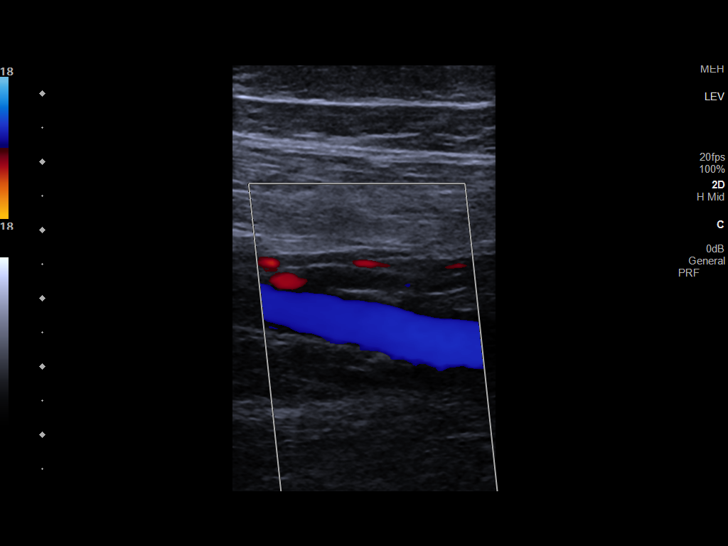
[im 19/34]
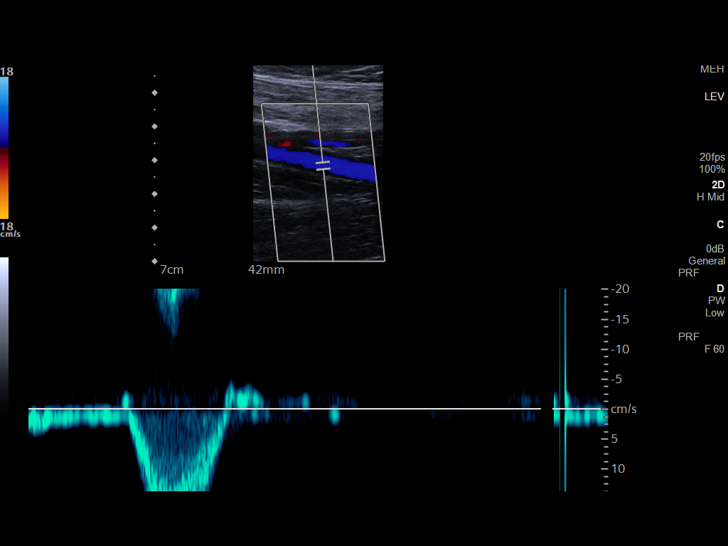
[im 22/34]
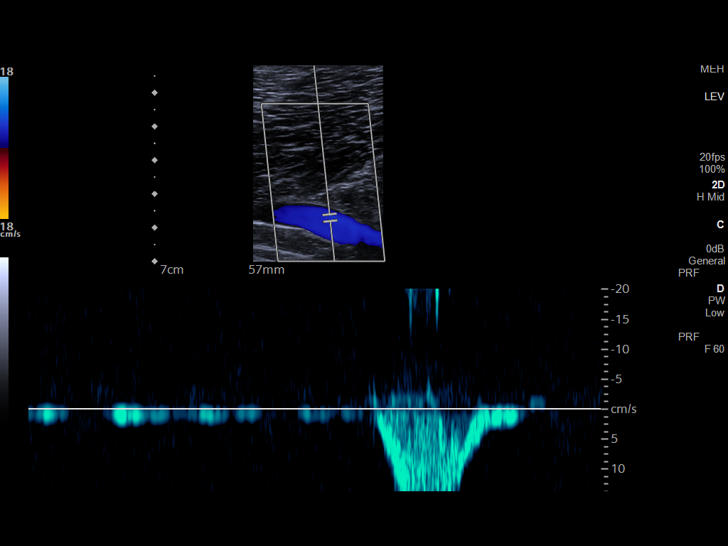
[im 25/34]
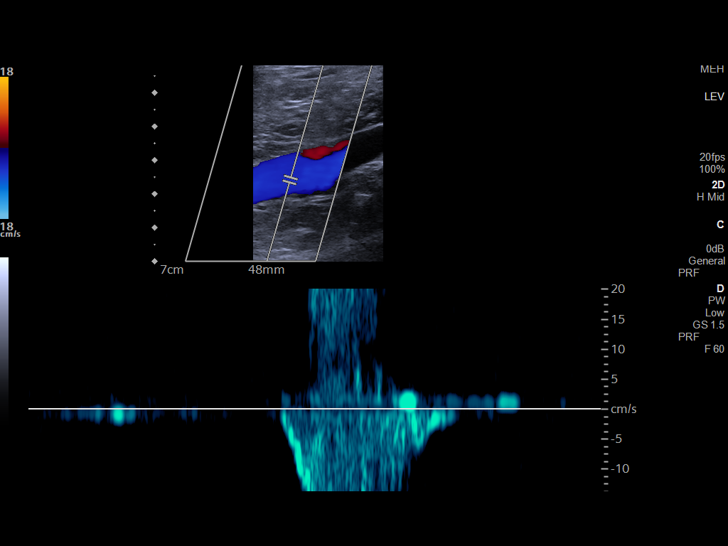
[im 28/34]
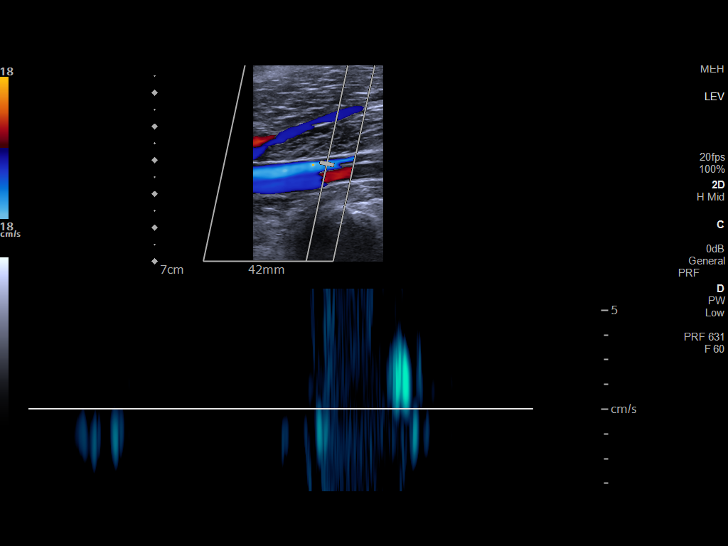
[im 31/34]
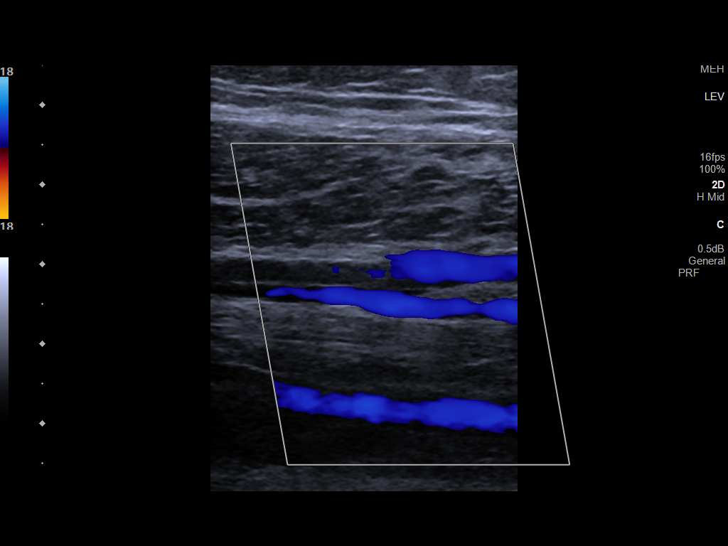
[im 34/34]
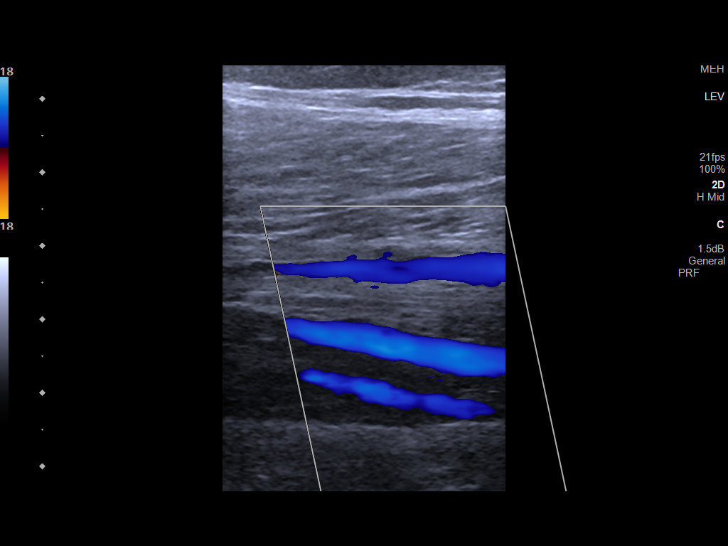

[13 of 24 positions shown; findings below may reference images not displayed]

FINDINGS: Contralateral Common Femoral Vein: Respiratory phasicity is normal
and symmetric with the symptomatic side. No evidence of thrombus.
Normal compressibility.

Common Femoral Vein: No evidence of thrombus. Normal
compressibility, respiratory phasicity and response to augmentation.

Saphenofemoral Junction: No evidence of thrombus. Normal
compressibility and flow on color Doppler imaging.

Profunda Femoral Vein: No evidence of thrombus. Normal
compressibility and flow on color Doppler imaging.

Femoral Vein: No evidence of thrombus. Normal compressibility,
respiratory phasicity and response to augmentation.

Popliteal Vein: No evidence of thrombus. Normal compressibility,
respiratory phasicity and response to augmentation.

Calf Veins: No evidence of thrombus. Normal compressibility and flow
on color Doppler imaging.

Superficial Great Saphenous Vein: No evidence of thrombus. Normal
compressibility.

Venous Reflux:  None.

Other Findings:  None.
IMPRESSION: No evidence of deep venous thrombosis.

## 2023-12-14 IMAGING — US US ABDOMEN LIMITED
1 series · 14 of 25 positions shown · non-contrast
Comparison: None.

CLINICAL DATA: Right upper quadrant pain

EXAM:
ULTRASOUND ABDOMEN LIMITED RIGHT UPPER QUADRANT

[Series 1: us abdomen limited ruq (liver/gb) · 76 acquisitions, 14 frames shown]
[im 1/76]
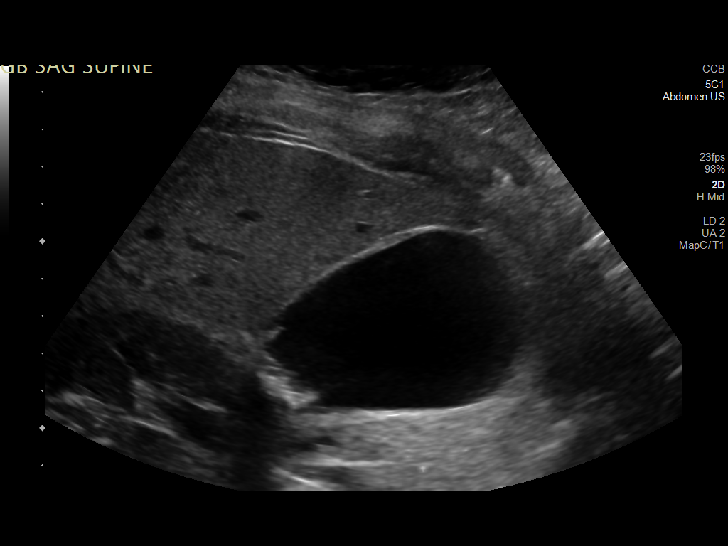
[im 7/76]
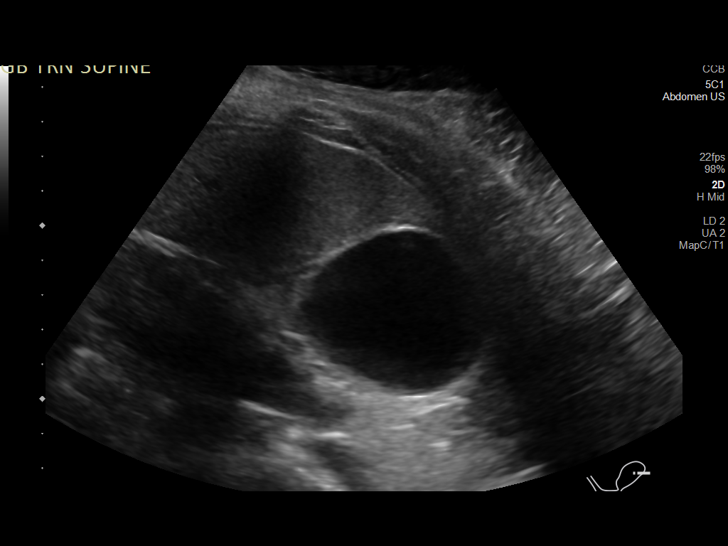
[im 13/76]
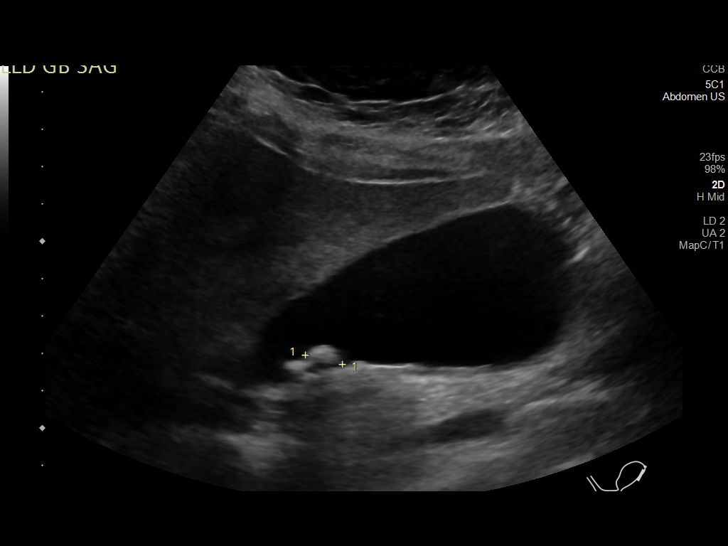
[im 19/76]
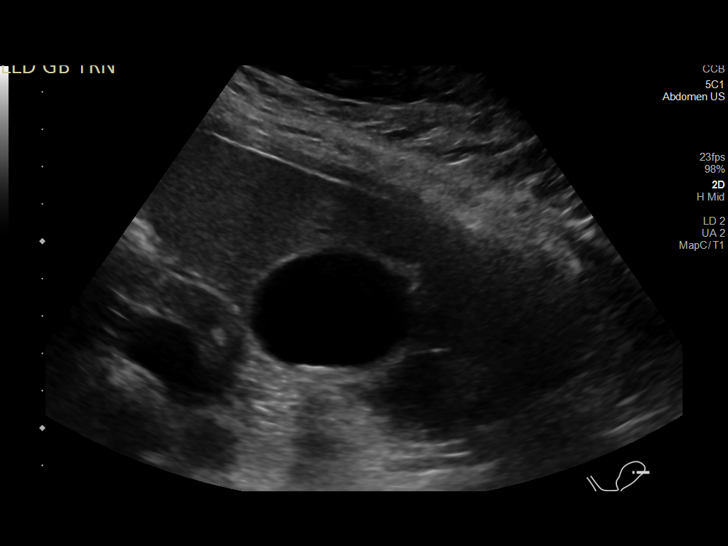
[im 26/76]
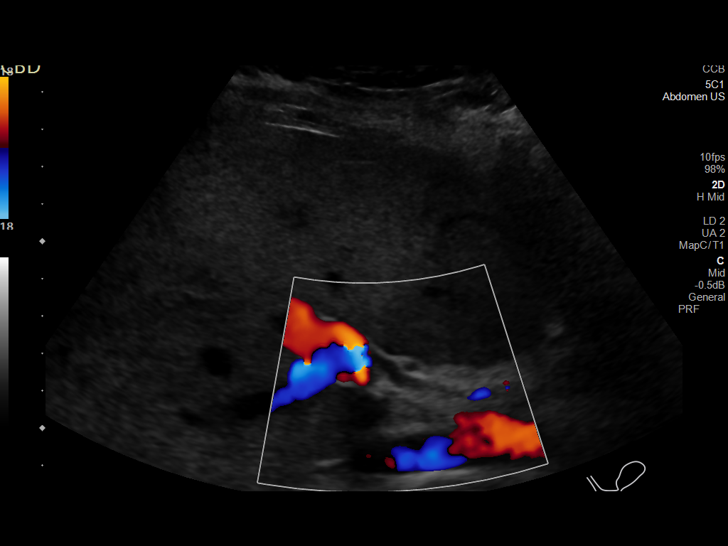
[im 29/76]
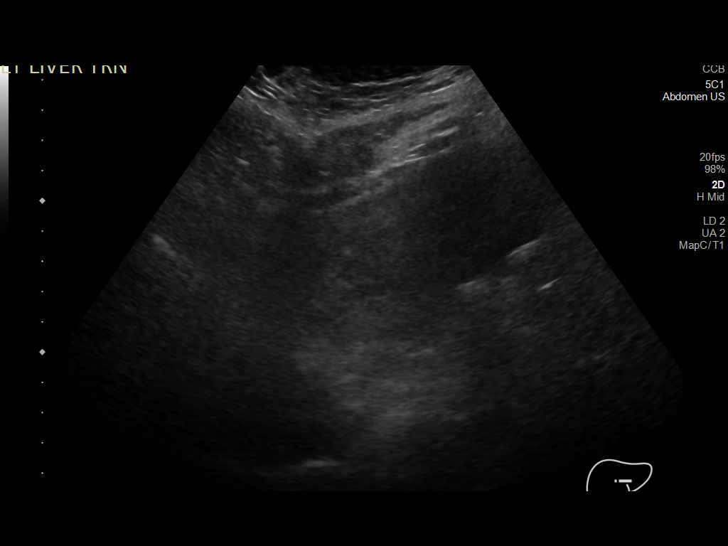
[im 35/76]
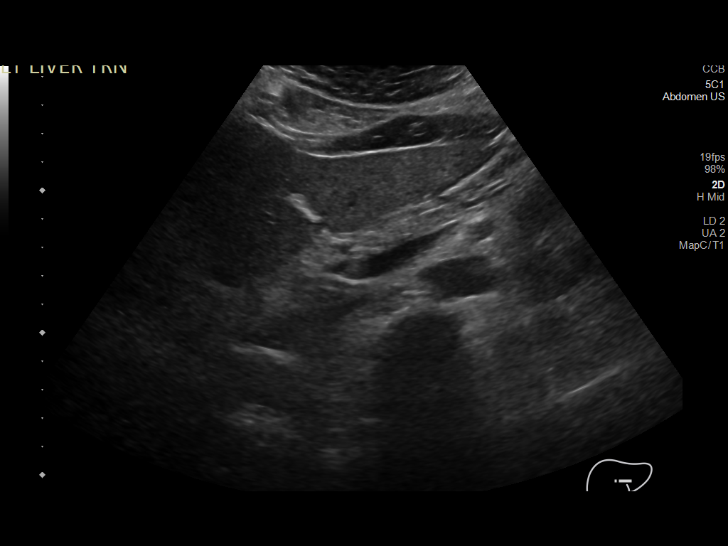
[im 41/76]
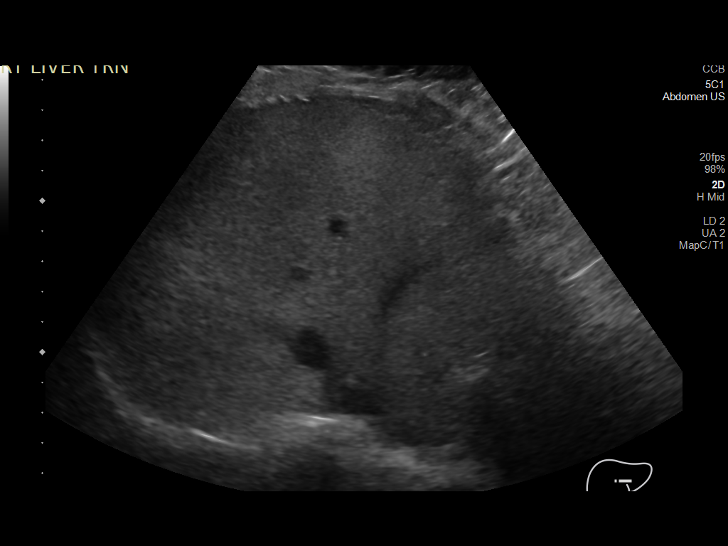
[im 47/76]
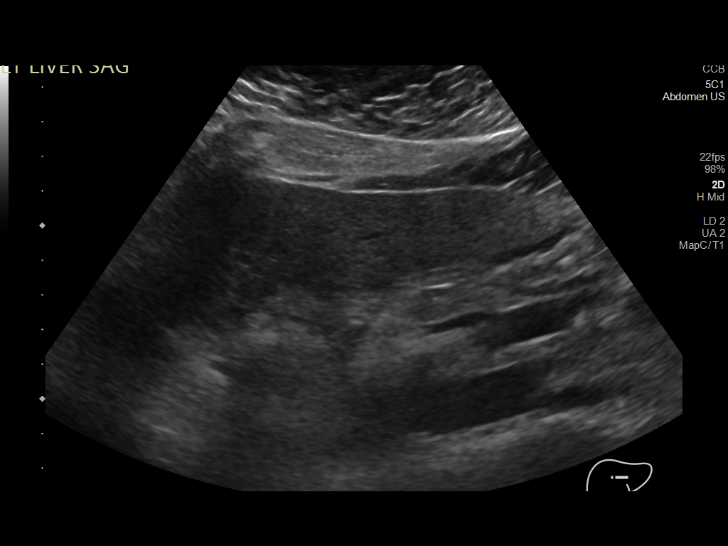
[im 51/76]
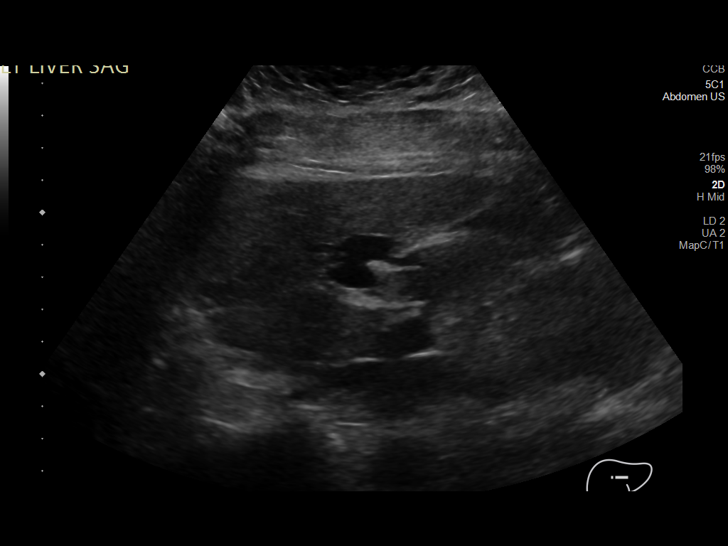
[im 57/76]
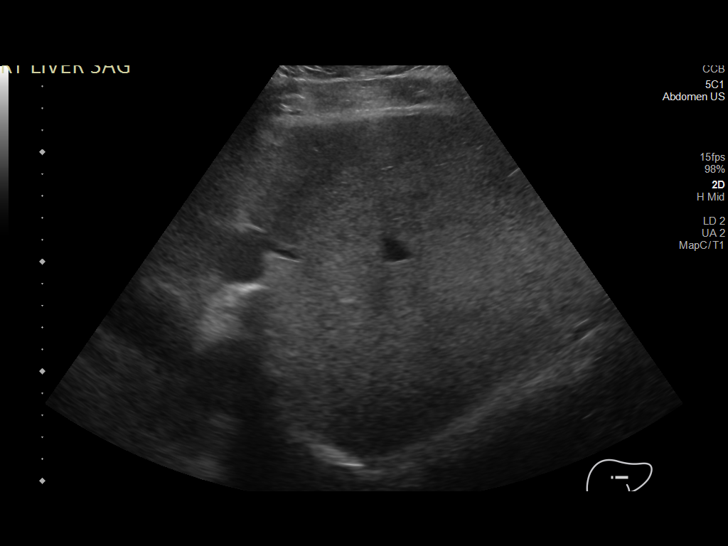
[im 63/76]
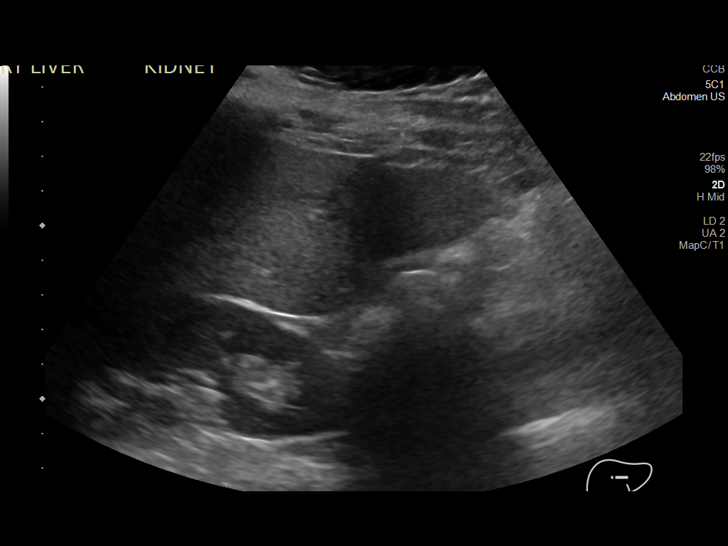
[im 69/76]
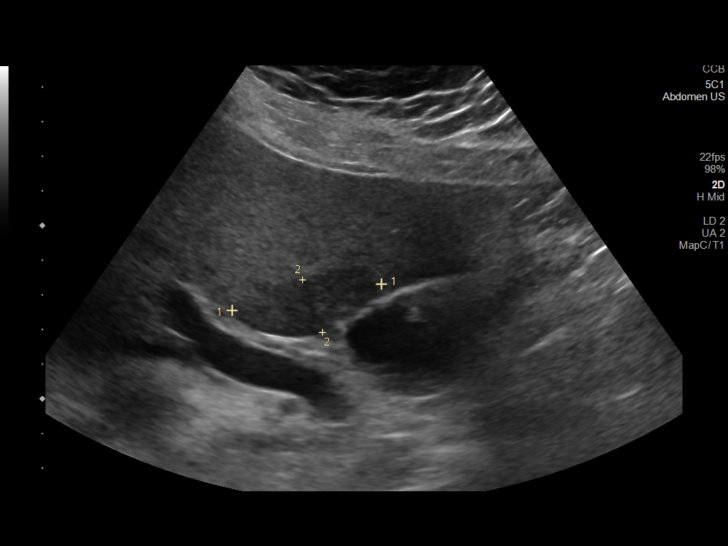
[im 76/76]
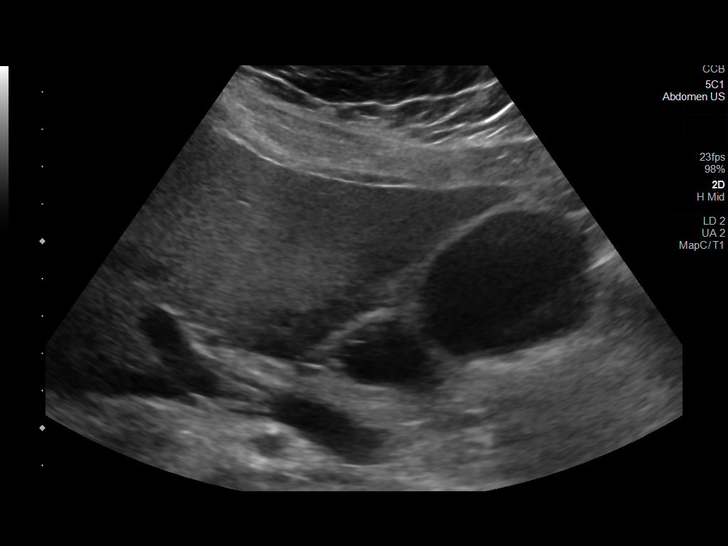

[14 of 25 positions shown; findings below may reference images not displayed]

FINDINGS: Gallbladder:

Distended gallbladder with calculi but no wall thickening or focal
tenderness.

Common bile duct:

Diameter: 4 mm

Liver:

Echogenic liver when compared to the kidney with geographic area of
central sparing. No masslike areas. Portal vein is patent on color
Doppler imaging with normal direction of blood flow towards the
liver.
IMPRESSION: 1. Cholelithiasis and distended gallbladder but no gallbladder wall
thickening or focal tenderness typical of acute cholecystitis.
2. Hepatic steatosis.
